# Patient Record
Sex: Female | Born: 1960 | Race: White | Hispanic: No | Marital: Married | Smoking: Former smoker
Health system: Southern US, Community
[De-identification: ages and names within clinical notes are randomized; demographics above are authoritative.]

## PROBLEM LIST (undated history)

## (undated) DIAGNOSIS — J449 Chronic obstructive pulmonary disease, unspecified: Secondary | ICD-10-CM

## (undated) DIAGNOSIS — G609 Hereditary and idiopathic neuropathy, unspecified: Secondary | ICD-10-CM

## (undated) DIAGNOSIS — F319 Bipolar disorder, unspecified: Secondary | ICD-10-CM

## (undated) DIAGNOSIS — N189 Chronic kidney disease, unspecified: Secondary | ICD-10-CM

## (undated) DIAGNOSIS — G629 Polyneuropathy, unspecified: Secondary | ICD-10-CM

## (undated) DIAGNOSIS — Z9109 Other allergy status, other than to drugs and biological substances: Secondary | ICD-10-CM

## (undated) DIAGNOSIS — N8111 Cystocele, midline: Secondary | ICD-10-CM

## (undated) DIAGNOSIS — I739 Peripheral vascular disease, unspecified: Secondary | ICD-10-CM

## (undated) DIAGNOSIS — E782 Mixed hyperlipidemia: Secondary | ICD-10-CM

## (undated) DIAGNOSIS — D369 Benign neoplasm, unspecified site: Secondary | ICD-10-CM

## (undated) DIAGNOSIS — N8110 Cystocele, unspecified: Secondary | ICD-10-CM

## (undated) HISTORY — DX: Cystocele, midline: N81.11

## (undated) HISTORY — DX: Hereditary and idiopathic neuropathy, unspecified: G60.9

## (undated) HISTORY — DX: Chronic obstructive pulmonary disease, unspecified: J44.9

## (undated) HISTORY — DX: Mixed hyperlipidemia: E78.2

## (undated) HISTORY — DX: Bipolar disorder, unspecified: F31.9

## (undated) HISTORY — PX: VARICOSE VEIN SURGERY: SHX832

## (undated) HISTORY — DX: Cystocele, unspecified: N81.10

## (undated) HISTORY — DX: Benign neoplasm, unspecified site: D36.9

## (undated) HISTORY — DX: Polyneuropathy, unspecified: G62.9

## (undated) HISTORY — PX: TONSILLECTOMY AND ADENOIDECTOMY: SUR1326

## (undated) HISTORY — PX: LUMBAR DISC SURGERY: SHX700

---

## 2007-10-19 ENCOUNTER — Other Ambulatory Visit: Admission: RE | Admit: 2007-10-19 | Discharge: 2007-10-19 | Payer: Self-pay | Admitting: Family Medicine

## 2007-11-01 ENCOUNTER — Ambulatory Visit: Payer: Self-pay | Admitting: Vascular Surgery

## 2009-07-25 ENCOUNTER — Other Ambulatory Visit: Admission: RE | Admit: 2009-07-25 | Discharge: 2009-07-25 | Payer: Self-pay | Admitting: Obstetrics and Gynecology

## 2009-08-04 ENCOUNTER — Encounter: Admission: RE | Admit: 2009-08-04 | Discharge: 2009-08-04 | Payer: Self-pay | Admitting: Obstetrics and Gynecology

## 2009-09-16 ENCOUNTER — Encounter (INDEPENDENT_AMBULATORY_CARE_PROVIDER_SITE_OTHER): Payer: Self-pay | Admitting: Obstetrics and Gynecology

## 2009-09-16 ENCOUNTER — Ambulatory Visit (HOSPITAL_COMMUNITY): Admission: RE | Admit: 2009-09-16 | Discharge: 2009-09-17 | Payer: Self-pay | Admitting: Obstetrics and Gynecology

## 2009-09-16 HISTORY — PX: VAGINAL HYSTERECTOMY: SUR661

## 2009-11-14 ENCOUNTER — Ambulatory Visit: Payer: Self-pay | Admitting: Vascular Surgery

## 2010-09-16 ENCOUNTER — Ambulatory Visit: Admit: 2010-09-16 | Payer: Self-pay | Admitting: Vascular Surgery

## 2010-11-09 LAB — TYPE AND SCREEN: ABO/RH(D): A POS

## 2010-11-09 LAB — CBC
HCT: 34.5 % — ABNORMAL LOW (ref 36.0–46.0)
Hemoglobin: 11.5 g/dL — ABNORMAL LOW (ref 12.0–15.0)
MCV: 90.9 fL (ref 78.0–100.0)
Platelets: 240 10*3/uL (ref 150–400)
RBC: 3.75 MIL/uL — ABNORMAL LOW (ref 3.87–5.11)
RBC: 4.27 MIL/uL (ref 3.87–5.11)
RDW: 13.4 % (ref 11.5–15.5)
WBC: 10.6 10*3/uL — ABNORMAL HIGH (ref 4.0–10.5)
WBC: 5.7 10*3/uL (ref 4.0–10.5)

## 2010-11-09 LAB — PREGNANCY, URINE: Preg Test, Ur: NEGATIVE

## 2010-11-09 LAB — ABO/RH: ABO/RH(D): A POS

## 2010-11-09 LAB — BASIC METABOLIC PANEL
BUN: 9 mg/dL (ref 6–23)
Chloride: 105 mEq/L (ref 96–112)
GFR calc non Af Amer: >60 mL/min (ref >60)
Glucose, Bld: 84 mg/dL (ref 70–99)
Potassium: 3.6 mEq/L (ref 3.5–5.1)
Sodium: 135 mEq/L (ref 135–145)

## 2010-11-09 LAB — URINALYSIS, ROUTINE W REFLEX MICROSCOPIC
Glucose, UA: NEGATIVE mg/dL
Hgb urine dipstick: NEGATIVE
Ketones, ur: NEGATIVE mg/dL
Protein, ur: NEGATIVE mg/dL
pH: 6 (ref 5.0–8.0)

## 2011-01-05 NOTE — Consult Note (Signed)
NEW PATIENT CONSULTATION   Sheila, Spencer  DOB:  01-24-1961                                       11/01/2007  [id number]#:[id number]   Patient presents today for evaluation of right leg discomfort.  She is a  healthy 50 year old white female with progressive discomfort over both  legs, most particularly in her right.  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  She does not have any history of hemorrhage from these.  She does have  frequent episodes of subcutaneous rupture of the varicosities with  bruising.  She has not worn any compression garments and is not taking  any current pain medication.  She does elevate her legs as much as  possible.   PAST MEDICAL HISTORY:  Significant for asthma, allergic rhinitis, and  bipolar disorder.   PAST SURGICAL HISTORY:  Significant for tonsillitis.   FAMILY HISTORY:  Significant for varicosities in her mother and sister.   She is married with one child.  She does smoke cigarettes.  She does  have several alcohol drinks per day.   Her weight is reported at 186 pounds.  She is 5 foot 7 inches tall.  Review of systems otherwise unremarkable.   Allergies are to penicillin, which causes respiratory distress.   Her only medication currently is Care[employer] adviser.   PHYSICAL EXAMINATION:  A well-developed and well-nourished white female  appearing her stated age of 50.  Blood pressure is 128/57, heart rate  77, respirations 18.  She does have 2+ dorsalis pedis pulses  bilaterally.  She has slight reticular varicosities in her left leg, and  her right leg does have marked varicosities extending from her medial  thigh past her knee and down onto her pretibial area.   She underwent screening hand-held duplex by me in the office, and this  does confirm gross reflux in her saphenous vein, feeding these  varicosities.  I have discussed options with the patient.  I have  recommended that we  being graduated compression garments and have fitted  her with thigh-high 20-30 mmHg garments.  She will continue with these  and attempt treatment with ibuprofen for discomfort.  I plan to see her  again in three months with formal duplex at that time.   Larina Earthly, M.D.  Electronically Signed   TFE/MEDQ  D:  11/01/2007  T:  11/02/2007  Job:  [phone]

## 2012-01-14 ENCOUNTER — Other Ambulatory Visit: Payer: Self-pay

## 2012-01-14 DIAGNOSIS — I83893 Varicose veins of bilateral lower extremities with other complications: Secondary | ICD-10-CM

## 2012-01-31 ENCOUNTER — Other Ambulatory Visit: Payer: Self-pay | Admitting: Family Medicine

## 2012-01-31 ENCOUNTER — Other Ambulatory Visit: Payer: Self-pay | Admitting: Obstetrics and Gynecology

## 2012-01-31 DIAGNOSIS — Z1231 Encounter for screening mammogram for malignant neoplasm of breast: Secondary | ICD-10-CM

## 2012-02-11 ENCOUNTER — Ambulatory Visit
Admission: RE | Admit: 2012-02-11 | Discharge: 2012-02-11 | Disposition: A | Discharge status: Home / Self Care | Payer: PRIVATE HEALTH INSURANCE | Source: Ambulatory Visit | Attending: Family Medicine | Admitting: Family Medicine

## 2012-02-11 ENCOUNTER — Ambulatory Visit: Payer: Self-pay

## 2012-02-11 DIAGNOSIS — Z1231 Encounter for screening mammogram for malignant neoplasm of breast: Secondary | ICD-10-CM

## 2012-02-17 ENCOUNTER — Encounter: Payer: Self-pay | Admitting: Vascular Surgery

## 2012-03-13 ENCOUNTER — Encounter: Payer: Self-pay | Admitting: Vascular Surgery

## 2012-03-23 ENCOUNTER — Encounter: Payer: Self-pay | Admitting: Vascular Surgery

## 2012-03-24 ENCOUNTER — Encounter: Payer: Self-pay | Admitting: Vascular Surgery

## 2012-04-28 ENCOUNTER — Other Ambulatory Visit: Payer: Self-pay | Admitting: Gastroenterology

## 2012-06-20 ENCOUNTER — Encounter (INDEPENDENT_AMBULATORY_CARE_PROVIDER_SITE_OTHER): Payer: Self-pay

## 2012-06-28 ENCOUNTER — Encounter (INDEPENDENT_AMBULATORY_CARE_PROVIDER_SITE_OTHER): Payer: Self-pay | Admitting: General Surgery

## 2012-07-04 ENCOUNTER — Encounter (INDEPENDENT_AMBULATORY_CARE_PROVIDER_SITE_OTHER): Payer: Self-pay | Admitting: General Surgery

## 2012-07-04 ENCOUNTER — Ambulatory Visit (INDEPENDENT_AMBULATORY_CARE_PROVIDER_SITE_OTHER): Payer: BC Managed Care – PPO | Admitting: General Surgery

## 2012-07-04 VITALS — BP 118/68 | HR 80 | Temp 97.7°F | Resp 20 | Ht 68.5 in | Wt 184.6 lb

## 2012-07-04 DIAGNOSIS — N824 Other female intestinal-genital tract fistulae: Secondary | ICD-10-CM

## 2012-07-04 DIAGNOSIS — N823 Fistula of vagina to large intestine: Secondary | ICD-10-CM

## 2012-07-04 NOTE — Patient Instructions (Signed)
  Fiber Chart  You should 25-30g of fiber per day and drinking 8 glasses of water to help your bowels move regularly.  In the chart below you can look up how much fiber you are getting in an average day.  If you are not getting enough fiber, you should add a fiber supplement to your diet.  Examples of this include Metamucil, FiberCon and Citrucel.  These can be purchased at your local grocery store or pharmacy.      http://www.[employer].edu/offices/health/nutritioncoach/AtoZ/handouts/Fiber.pdf    GETTING TO GOOD BOWEL HEALTH. Irregular bowel habits such as constipation can lead to many problems over time.  Having one soft bowel movement a day is the most important way to prevent further problems.  The anorectal canal is designed to handle stretching and feces to safely manage our ability to get rid of solid waste (feces, poop, stool) out of our body.  BUT, hard constipated stools can act like ripping concrete bricks causing inflamed hemorrhoids, anal fissures, abdominal pain and bloating.     The goal: ONE SOFT BOWEL MOVEMENT A DAY!  To have soft, regular bowel movements:    Drink at least 8 tall glasses of water a day.     Take plenty of fiber.  Fiber is the undigested part of plant food that passes into the colon, acting s "natures broom" to encourage bowel motility and movement.  Fiber can absorb and hold large amounts of water. This results in a larger, bulkier stool, which is soft and easier to pass. Work gradually over several weeks up to 6 servings a day of fiber (25g a day even more if needed) in the form of: o Vegetables -- Root (potatoes, carrots, turnips), leafy green (lettuce, salad greens, celery, spinach), or cooked high residue (cabbage, broccoli, etc) o Fruit -- Fresh (unpeeled skin & pulp), Dried (prunes, apricots, cherries, etc ),  or stewed ( applesauce)  o Whole grain breads, pasta, etc (whole wheat)  o Bran cereals    Bulking Agents -- This type of water-retaining fiber  generally is easily obtained each day by one of the following:  o Psyllium bran -- The psyllium plant is remarkable because its ground seeds can retain so much water. This product is available as Metamucil, Konsyl, Effersyllium, Per Diem Fiber, or the less expensive generic preparation in drug and health food stores. Although labeled a laxative, it really is not a laxative.  o Methylcellulose -- This is another fiber derived from wood which also retains water. It is available as Citrucel. o Polyethylene Glycol - and "artificial" fiber commonly called Miralax or Glycolax.  It is helpful for people with gassy or bloated feelings with regular fiber o Flax Seed - a less gassy fiber than psyllium   No reading or other relaxing activity while on the toilet. If bowel movements take longer than 5 minutes, you are too constipated.   AVOID CONSTIPATION.  High fiber and water intake usually takes care of this.  Sometimes a laxative is needed to stimulate more frequent bowel movements, but    Laxatives are not a good long-term solution as it can wear the colon out. o Osmotics (Milk of Magnesia, Fleets phosphosoda, Magnesium citrate, MiraLax, GoLytely) are safer than  o Stimulants (Senokot, Castor Oil, Dulcolax, Ex Lax)    o Do not take laxatives for more than 7days in a row.    IF SEVERELY CONSTIPATED, try a Bowel Retraining Program: o Do not use laxatives.  o Eat a diet high in   roughage, such as bran cereals and leafy vegetables.  o Drink six (6) ounces of prune or apricot juice each morning.  o Eat two (2) large servings of stewed fruit each day.  o Take one (1) heaping tablespoon of a psyllium-based bulking agent twice a day. Use sugar-free sweetener when possible to avoid excessive calories.  o Eat a normal breakfast.  o Set aside 15 minutes after breakfast to sit on the toilet, but do not strain to have a bowel movement.  o If you do not have a bowel movement by the third day, use an enema and repeat the  above steps.   
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
## 2012-08-01 ENCOUNTER — Encounter (HOSPITAL_COMMUNITY): Payer: Self-pay | Admitting: Pharmacy Technician

## 2012-08-01 ENCOUNTER — Ambulatory Visit (INDEPENDENT_AMBULATORY_CARE_PROVIDER_SITE_OTHER): Payer: BC Managed Care – PPO | Admitting: General Surgery

## 2012-08-01 ENCOUNTER — Encounter (INDEPENDENT_AMBULATORY_CARE_PROVIDER_SITE_OTHER): Payer: Self-pay | Admitting: General Surgery

## 2012-08-01 VITALS — BP 120/78 | HR 72 | Temp 96.9°F | Resp 18 | Ht 68.5 in | Wt 184.0 lb

## 2012-08-01 DIAGNOSIS — N824 Other female intestinal-genital tract fistulae: Secondary | ICD-10-CM

## 2012-08-01 DIAGNOSIS — N823 Fistula of vagina to large intestine: Secondary | ICD-10-CM

## 2012-08-01 NOTE — Patient Instructions (Signed)
Anal Fistula   What is the treatment of an anal fistula? Surgery is almost always necessary to cure an anal fistula. Although surgery can be fairly straightforward, it may also be complicated, occasionally requiring staged or multiple operations. Consider identifying a specialist in colon and rectal surgery who would be familiar with a number of potential operations to treat the fistula. The surgery may be performed at the same time as drainage of an abscess, although sometimes the fistula doesn't appear until weeks to years after the initial drainage. If the fistula is straightforward, a fistulotomy may be performed. This procedure involves connecting the internal opening within the anal canal to the external opening, creating a groove that will heal from the inside out. This surgery often will require dividing a small portion of the sphincter muscle which has the unlikely potential for affecting the control of bowel movements in a limited number of cases.  Other procedures include placing material within the fistula tract to occlude it or surgically altering the surrounding tissue to accomplish closure of the fistula, with the choice of procedure depending upon the type, length, and location of the fistula. Most of the operations can be performed on an outpatient basis, but may occasionally require hospitalization. What is the recovery like from surgery? Pain after surgery is controlled with pain pills, fiber and bulk laxatives. Patients should plan for time at home using sitz baths and attempt to avoid the constipation that can be associated with prescription pain medication. Discuss with your surgeon the specific care and time away from work prior to surgery to prepare yourself for post-operative care. Can the abscess or fistula recur? If adequately treated and properly healed, both are unlikely to return. However, despite proper and indicated open or minimally invasive treatment, both abscesses and  fistulas can potentially recur. Should similar symptoms arise, suggesting recurrence, it is recommended that you find a colon and rectal surgeon to manage your condition. What is a colon and rectal surgeon? Colon and rectal surgeons are experts in the surgical and non-surgical treatment of diseases of the colon, rectum and anus. They have completed advanced surgical training in the treatment of these diseases as well as full general surgical training. Board-certified colon and rectal surgeons complete residencies in general surgery and colon and rectal surgery, and pass intensive examinations conducted by the American Board of Surgery and the American Board of Colon and Rectal Surgery. They are well-versed in the treatment of both benign and malignant diseases of the colon, rectum and anus and are able to perform routine screening examinations and surgically treat conditions if indicated to do so.  author: Mikle Bosworth, MD, FACS, FASCRS, on behalf of the ASCRS Public Relations Committee  [id number] American Society of Colon & Rectal Surgeons
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
## 2012-08-03 NOTE — Patient Instructions (Signed)
20 Sheila Spencer  08/03/2012   Your procedure is scheduled on: 08/07/12   Report to [facility] at 0530 AM.  Call this number if you have problems the morning of surgery: [phone]   Remember: fleets enema    Do not eat food:After Midnight.  May have clear liquids:until Midnight .  Marland Kitchen  Take these medicines the morning of surgery with A SIP OF WATER:    Do not wear jewelry, make-up or nail polish.  Do not wear lotions, powders, or perfumes.   Do not shave 48 hours prior to surgery. .  Do not bring valuables to the hospital.  Contacts, dentures or bridgework may not be worn into surgery.      Patients discharged the day of surgery will not be allowed to drive home.  Name and phone number of your driver:    SEE CHG INSTRUCTION SHEET   Please read over the following fact sheets that you were given: MRSA Information, coughing and deep breathing exercises, leg exercises              Failure to comply with these instructions may result in cancellation of your surgery.              Patient Signature ________________________________________________________              Nurse Signature ________________________________________________________

## 2012-08-04 ENCOUNTER — Encounter (HOSPITAL_COMMUNITY)
Admission: RE | Admit: 2012-08-04 | Discharge: 2012-08-04 | Disposition: A | Discharge status: Home / Self Care | Payer: BC Managed Care – PPO | Source: Ambulatory Visit | Attending: General Surgery | Admitting: General Surgery

## 2012-08-04 ENCOUNTER — Encounter (HOSPITAL_COMMUNITY): Payer: Self-pay

## 2012-08-04 HISTORY — DX: Peripheral vascular disease, unspecified: I73.9

## 2012-08-04 HISTORY — DX: Chronic kidney disease, unspecified: N18.9

## 2012-08-04 LAB — CBC
HCT: 41.5 % (ref 36.0–46.0)
Hemoglobin: 14.2 g/dL (ref 12.0–15.0)
MCH: 31.9 pg (ref 26.0–34.0)
MCHC: 34.2 g/dL (ref 30.0–36.0)
MCV: 93.3 fL (ref 78.0–100.0)
RBC: 4.45 MIL/uL (ref 3.87–5.11)

## 2012-08-04 LAB — SURGICAL PCR SCREEN: MRSA, PCR: POSITIVE — AB

## 2012-08-04 NOTE — Progress Notes (Signed)
Spoke with pharmacy regarding patient allergy to egg and egg derived products.  Asked pharmacy would CHG liquid be ok for patient to use;  Spoke with [facility] and she stated no egg or egg derived products in CHG.

## 2012-08-04 NOTE — Progress Notes (Signed)
Order placed for EKG morning of surgery. Did not see EKG when checking chart. Per anesthesia protocol, patient requires EKG.

## 2012-08-04 NOTE — Progress Notes (Signed)
Left message for patient on [phone] regarding positive for staph and MRSA on Pcr Screen.  Asked patient to return call to make sure she received message and understood instructions.

## 2012-08-04 NOTE — Progress Notes (Signed)
Contacted patient regarding positive pcr results for staph and MRSA.  Patient understands to obtain prescription for Mupirocin and follow instructions.

## 2012-08-06 NOTE — Anesthesia Preprocedure Evaluation (Addendum)
Anesthesia Evaluation  Patient identified by MRN, date of birth, ID band Patient awake    Reviewed: Allergy & Precautions, H&P , NPO status , Patient's Chart, lab work & pertinent test results  Airway Mallampati: II TM Distance: >3 FB Neck ROM: Full    Dental  (+) Teeth Intact and Dental Advisory Given   Pulmonary COPD   Pulmonary exam normal       Cardiovascular - Peripheral Vascular Disease Rhythm:Regular Rate:Normal     Neuro/Psych Bipolar Disorder  Neuromuscular disease    GI/Hepatic negative GI ROS, Neg liver ROS,   Endo/Other  negative endocrine ROS  Renal/GU Renal diseasenegative Renal ROS  negative genitourinary   Musculoskeletal negative musculoskeletal ROS (+)   Abdominal   Peds negative pediatric ROS (+)  Hematology negative hematology ROS (+)   Anesthesia Other Findings   Reproductive/Obstetrics negative OB ROS                          Anesthesia Physical Anesthesia Plan  ASA: II  Anesthesia Plan: General   Post-op Pain Management:    Induction: Intravenous  Airway Management Planned: Oral ETT  Additional Equipment:   Intra-op Plan:   Post-operative Plan: Extubation in OR  Informed Consent: I have reviewed the patients History and Physical, chart, labs and discussed the procedure including the risks, benefits and alternatives for the proposed anesthesia with the patient or authorized representative who has indicated his/her understanding and acceptance.   Dental advisory given  Plan Discussed with: CRNA  Anesthesia Plan Comments: (Note egg allergy)        Anesthesia Quick Evaluation

## 2012-08-07 ENCOUNTER — Encounter (HOSPITAL_COMMUNITY): Payer: Self-pay | Admitting: Anesthesiology

## 2012-08-07 ENCOUNTER — Encounter (HOSPITAL_COMMUNITY)
Admission: RE | Disposition: A | Discharge status: Home / Self Care | Payer: Self-pay | Source: Ambulatory Visit | Attending: General Surgery

## 2012-08-07 ENCOUNTER — Ambulatory Visit (HOSPITAL_COMMUNITY): Payer: BC Managed Care – PPO | Admitting: Anesthesiology

## 2012-08-07 ENCOUNTER — Encounter (HOSPITAL_COMMUNITY): Payer: Self-pay | Admitting: *Deleted

## 2012-08-07 ENCOUNTER — Ambulatory Visit (HOSPITAL_COMMUNITY)
Admission: RE | Admit: 2012-08-07 | Discharge: 2012-08-07 | Disposition: A | Discharge status: Home / Self Care | Payer: BC Managed Care – PPO | Source: Ambulatory Visit | Attending: General Surgery | Admitting: General Surgery

## 2012-08-07 DIAGNOSIS — Z79899 Other long term (current) drug therapy: Secondary | ICD-10-CM | POA: Insufficient documentation

## 2012-08-07 DIAGNOSIS — N824 Other female intestinal-genital tract fistulae: Secondary | ICD-10-CM | POA: Insufficient documentation

## 2012-08-07 DIAGNOSIS — Z01812 Encounter for preprocedural laboratory examination: Secondary | ICD-10-CM | POA: Insufficient documentation

## 2012-08-07 DIAGNOSIS — J4489 Other specified chronic obstructive pulmonary disease: Secondary | ICD-10-CM | POA: Insufficient documentation

## 2012-08-07 DIAGNOSIS — J449 Chronic obstructive pulmonary disease, unspecified: Secondary | ICD-10-CM | POA: Insufficient documentation

## 2012-08-07 HISTORY — PX: ANAL FISTULECTOMY: SHX1139

## 2012-08-07 SURGERY — FISTULECTOMY, ANAL
Anesthesia: General | Site: Buttocks | Wound class: Clean Contaminated

## 2012-08-07 MED ORDER — SODIUM CHLORIDE 0.9 % IV SOLN: 250.0000 mL | INTRAVENOUS | Status: DC | PRN

## 2012-08-07 MED ORDER — LIDOCAINE HCL 2 % EX GEL
CUTANEOUS | Status: DC | PRN
  Administered 2012-08-07: 1 via TOPICAL

## 2012-08-07 MED ORDER — SODIUM CHLORIDE 0.9 % IJ SOLN: 3.0000 mL | Freq: Two times a day (BID) | INTRAMUSCULAR | Status: DC

## 2012-08-07 MED ORDER — ONDANSETRON HCL 4 MG/2ML IJ SOLN: 4.0000 mg | Freq: Four times a day (QID) | INTRAMUSCULAR | Status: DC | PRN

## 2012-08-07 MED ORDER — DOCUSATE SODIUM 100 MG PO CAPS: 100.0000 mg | ORAL_CAPSULE | Freq: Two times a day (BID) | ORAL | Status: DC

## 2012-08-07 MED ORDER — LACTATED RINGERS IV SOLN
INTRAVENOUS | Status: DC | PRN
  Administered 2012-08-07: 07:00:00 via INTRAVENOUS

## 2012-08-07 MED ORDER — HYDROCORTISONE SOD SUCCINATE 100 MG IJ SOLR
INTRAMUSCULAR | Status: DC | PRN
  Administered 2012-08-07: 100 mg via INTRAVENOUS

## 2012-08-07 MED ORDER — LIDOCAINE HCL 4 % MT SOLN
OROMUCOSAL | Status: DC | PRN
  Administered 2012-08-07: 4 mL via TOPICAL

## 2012-08-07 MED ORDER — LACTATED RINGERS IV SOLN
INTRAVENOUS | Status: DC
  Administered 2012-08-07: 1000 mL via INTRAVENOUS

## 2012-08-07 MED ORDER — METRONIDAZOLE IN NACL 5-0.79 MG/ML-% IV SOLN
500.0000 mg | INTRAVENOUS | Status: AC
  Administered 2012-08-07: 1 g via INTRAVENOUS

## 2012-08-07 MED ORDER — CIPROFLOXACIN IN D5W 400 MG/200ML IV SOLN
INTRAVENOUS | Status: AC
  Filled 2012-08-07: qty 200

## 2012-08-07 MED ORDER — LIDOCAINE HCL 2 % EX GEL
CUTANEOUS | Status: AC
  Filled 2012-08-07: qty 10

## 2012-08-07 MED ORDER — ETOMIDATE 2 MG/ML IV SOLN
INTRAVENOUS | Status: DC | PRN
  Administered 2012-08-07: 16 mg via INTRAVENOUS
  Administered 2012-08-07: 4 mg via INTRAVENOUS

## 2012-08-07 MED ORDER — LIDOCAINE HCL (CARDIAC) 20 MG/ML IV SOLN
INTRAVENOUS | Status: DC | PRN
  Administered 2012-08-07: 20 mg via INTRAVENOUS

## 2012-08-07 MED ORDER — FLEET ENEMA 7-19 GM/118ML RE ENEM: 1.0000 | ENEMA | Freq: Once | RECTAL | Status: DC

## 2012-08-07 MED ORDER — ACETAMINOPHEN 650 MG RE SUPP: 650.0000 mg | RECTAL | Status: DC | PRN

## 2012-08-07 MED ORDER — OXYCODONE HCL 5 MG PO TABS: 5.0000 mg | ORAL_TABLET | ORAL | Status: DC | PRN

## 2012-08-07 MED ORDER — ACETAMINOPHEN 10 MG/ML IV SOLN
INTRAVENOUS | Status: AC
  Filled 2012-08-07: qty 100

## 2012-08-07 MED ORDER — CIPROFLOXACIN IN D5W 400 MG/200ML IV SOLN
400.0000 mg | INTRAVENOUS | Status: AC
  Administered 2012-08-07: 400 mg via INTRAVENOUS

## 2012-08-07 MED ORDER — MIDAZOLAM HCL 5 MG/5ML IJ SOLN
INTRAMUSCULAR | Status: DC | PRN
  Administered 2012-08-07: 2 mg via INTRAVENOUS

## 2012-08-07 MED ORDER — BUPIVACAINE-EPINEPHRINE 0.25% -1:200000 IJ SOLN
INTRAMUSCULAR | Status: AC
  Filled 2012-08-07: qty 1

## 2012-08-07 MED ORDER — PROMETHAZINE HCL 25 MG/ML IJ SOLN: 6.2500 mg | INTRAMUSCULAR | Status: DC | PRN

## 2012-08-07 MED ORDER — SODIUM CHLORIDE 0.9 % IJ SOLN: 3.0000 mL | INTRAMUSCULAR | Status: DC | PRN

## 2012-08-07 MED ORDER — ACETAMINOPHEN 325 MG PO TABS: 650.0000 mg | ORAL_TABLET | ORAL | Status: DC | PRN

## 2012-08-07 MED ORDER — ONDANSETRON HCL 4 MG/2ML IJ SOLN
INTRAMUSCULAR | Status: DC | PRN
  Administered 2012-08-07: 4 mg via INTRAVENOUS

## 2012-08-07 MED ORDER — HYDROMORPHONE HCL PF 1 MG/ML IJ SOLN: 0.2500 mg | INTRAMUSCULAR | Status: DC | PRN

## 2012-08-07 MED ORDER — BUPIVACAINE-EPINEPHRINE 0.25% -1:200000 IJ SOLN
INTRAMUSCULAR | Status: DC | PRN
  Administered 2012-08-07: 10 mL

## 2012-08-07 MED ORDER — HYDROCORTISONE SOD SUCCINATE 100 MG IJ SOLR
INTRAMUSCULAR | Status: AC
  Filled 2012-08-07: qty 2

## 2012-08-07 MED ORDER — FENTANYL CITRATE 0.05 MG/ML IJ SOLN
INTRAMUSCULAR | Status: DC | PRN
  Administered 2012-08-07: 100 ug via INTRAVENOUS
  Administered 2012-08-07: 50 ug via INTRAVENOUS
  Administered 2012-08-07: 100 ug via INTRAVENOUS
  Administered 2012-08-07: 50 ug via INTRAVENOUS

## 2012-08-07 SURGICAL SUPPLY — 47 items
BLADE HEX COATED 2.75 (ELECTRODE) ×2 IMPLANT
BLADE SURG 15 STRL LF DISP TIS (BLADE) ×1 IMPLANT
BLADE SURG 15 STRL SS (BLADE) ×1
BRIEF STRETCH FOR OB PAD LRG (UNDERPADS AND DIAPERS) ×2 IMPLANT
CANISTER SUCTION 2500CC (MISCELLANEOUS) ×2 IMPLANT
CLOTH BEACON ORANGE TIMEOUT ST (SAFETY) ×2 IMPLANT
COVER MAYO STAND STRL (DRAPES) IMPLANT
DECANTER SPIKE VIAL GLASS SM (MISCELLANEOUS) ×2 IMPLANT
DRAPE LG THREE QUARTER DISP (DRAPES) IMPLANT
DRAPE UTILITY 15X26 (DRAPE) ×2 IMPLANT
DRSG PAD ABDOMINAL 8X10 ST (GAUZE/BANDAGES/DRESSINGS) ×2 IMPLANT
ELECT REM PT RETURN 9FT ADLT (ELECTROSURGICAL) ×2
ELECTRODE REM PT RTRN 9FT ADLT (ELECTROSURGICAL) ×1 IMPLANT
GAUZE SPONGE 4X4 16PLY XRAY LF (GAUZE/BANDAGES/DRESSINGS) ×2 IMPLANT
GAUZE VASELINE 3X9 (GAUZE/BANDAGES/DRESSINGS) IMPLANT
GLOVE BIO SURGEON STRL SZ7 (GLOVE) ×2 IMPLANT
GLOVE BIO SURGEON STRL SZ7.5 (GLOVE) ×2 IMPLANT
GLOVE BIOGEL M STRL SZ7.5 (GLOVE) IMPLANT
GLOVE BIOGEL PI IND STRL 7.0 (GLOVE) ×1 IMPLANT
GLOVE BIOGEL PI INDICATOR 7.0 (GLOVE) ×1
GLOVE INDICATOR 8.0 STRL GRN (GLOVE) ×2 IMPLANT
GOWN PREVENTION PLUS LG XLONG (DISPOSABLE) ×2 IMPLANT
GOWN PREVENTION PLUS XLARGE (GOWN DISPOSABLE) ×2 IMPLANT
GOWN STRL NON-REIN LRG LVL3 (GOWN DISPOSABLE) ×2 IMPLANT
GOWN STRL REIN XL XLG (GOWN DISPOSABLE) ×2 IMPLANT
KIT BASIN OR (CUSTOM PROCEDURE TRAY) ×2 IMPLANT
LUBRICANT JELLY K Y 4OZ (MISCELLANEOUS) ×2 IMPLANT
NDL SAFETY ECLIPSE 18X1.5 (NEEDLE) IMPLANT
NEEDLE HYPO 18GX1.5 SHARP (NEEDLE)
NEEDLE HYPO 25X1 1.5 SAFETY (NEEDLE) ×2 IMPLANT
NS IRRIG 1000ML POUR BTL (IV SOLUTION) ×2 IMPLANT
PACK BASIC VI WITH GOWN DISP (CUSTOM PROCEDURE TRAY) IMPLANT
PACK GENERAL/GYN (CUSTOM PROCEDURE TRAY) ×2 IMPLANT
PENCIL BUTTON HOLSTER BLD 10FT (ELECTRODE) IMPLANT
SPONGE GAUZE 4X4 12PLY (GAUZE/BANDAGES/DRESSINGS) ×4 IMPLANT
SPONGE SURGIFOAM ABS GEL 100 (HEMOSTASIS) ×2 IMPLANT
SPONGE SURGIFOAM ABS GEL 12-7 (HEMOSTASIS) ×4 IMPLANT
SUT CHROMIC 2 0 SH (SUTURE) ×6 IMPLANT
SUT CHROMIC 3 0 SH 27 (SUTURE) IMPLANT
SUT MON AB 3-0 SH 27 (SUTURE)
SUT MON AB 3-0 SH27 (SUTURE) IMPLANT
SUT VIC AB 2-0 SH 18 (SUTURE) ×2 IMPLANT
SUT VIC AB 4-0 P-3 18XBRD (SUTURE) IMPLANT
SUT VIC AB 4-0 P3 18 (SUTURE)
SYR CONTROL 10ML LL (SYRINGE) ×2 IMPLANT
TOWEL OR 17X26 10 PK STRL BLUE (TOWEL DISPOSABLE) ×2 IMPLANT
YANKAUER SUCT BULB TIP 10FT TU (MISCELLANEOUS) IMPLANT

## 2012-08-07 NOTE — Anesthesia Postprocedure Evaluation (Signed)
Anesthesia Post Note  Patient: Sheila Spencer  Procedure(s) Performed: Procedure(s) (LRB): FISTULECTOMY ANAL (N/A)  Anesthesia type: General  Patient location: PACU  Post pain: Pain level controlled  Post assessment: Post-op Vital signs reviewed  Last Vitals:  Filed Vitals:   08/07/12 1001  BP: 119/68  Pulse: 66  Temp: 36.4 C  Resp: 14    Post vital signs: Reviewed  Level of consciousness: sedated  Complications: No apparent anesthesia complications

## 2012-08-07 NOTE — Interval H&P Note (Signed)
History and Physical Interval Note:  08/07/2012 7:42 AM  Sheila Spencer  has presented today for surgery, with the diagnosis of Rectovaginal fistula  The various methods of treatment have been discussed with the patient and family. After consideration of risks, benefits and other options for treatment, the patient has consented to  Procedure(s) (LRB) with comments: FISTULECTOMY ANAL (N/A) - Mucosal Advancement Flap as a surgical intervention .  The patient's history has been reviewed, patient examined, no change in status, stable for surgery.  I have reviewed the patient's chart and labs.  Questions were answered to the patient's satisfaction.     Vanita Panda, MD  Colorectal and General Surgery [facility]

## 2012-08-07 NOTE — Op Note (Signed)
08/07/2012  9:14 AM  PATIENT:  Franklyn Lor  51 y.o. female  Patient Care Team: Gretel Acre, MD as PCP - General (Family Medicine)  PRE-OPERATIVE DIAGNOSIS:  Rectovaginal fistula  POST-OPERATIVE DIAGNOSIS:  Rectovaginal fistula  PROCEDURE:  Procedure(s): Rectal Mucosa Advancement Flap  SURGEON:  Surgeon(s): Romie Levee, MD  ASSISTANT: none   ANESTHESIA:   local and general  EBL:   30ml  Delay start of Pharmacological VTE agent (>24hrs) due to surgical blood loss or risk of bleeding:  not applicable  DRAINS: none   SPECIMEN:  No Specimen  DISPOSITION OF SPECIMEN:  N/A  COUNTS:  YES  PLAN OF CARE: Discharge to home after PACU  PATIENT DISPOSITION:  PACU - hemodynamically stable.  INDICATION: this is a 51 year old female who is status post rectovaginal fistula repair several years ago. It recurred. She is here today for another attempt at closure. The risk and benefits of this procedure were placed the patient regular and consent was signed and placed on chart.  OR FINDINGS: rectovaginal fistula  DESCRIPTION: the patient was identified in the preoperative holding area and taken to the OR where she was laid supine on the operating room gurney. General anesthesia was smoothly induced. The patient was then laid prone on the operating room table in jackknife position. Her buttocks were gently taped apart. Her perianal region was prepped and draped in the usual sterile fashion. A surgical timeout was performed indicating the correct patient, procedure, positioning and preoperative antibiotics. SCDs were also noted to be in place prior to the initiation of anesthesia. After this was completed a endoscopy was performed using a Hill-Ferguson anoscope. The rectovaginal fistula was identified anteriorly. It was an approximately 1 cm defect. I began by making a incision at the dentate line with the Bovie electrocautery. I then infused quarter percent Marcaine with epinephrine  submucosally to lift up the rectal flap. I then used Bovie and blunt dissection to elevate a rectal mucosa flap which was wider at the base than at the tip.  Hemostasis was obtained using direct pressure and focused Bovie electrocautery. After the flap was mobilized I divided the plane between the vagina and the muscular layer of the rectus. The muscular layer was closed over the vaginal mucosa with 2-0 Vicryl sutures. The vaginal mucosa was also closed using 2-0 Vicryl sutures. I then closed the sides of the advancement flap using a running 2-0 chromic suture on either side. I then brought the flap over the area of the fistula and close the edge of the flap to the dentate line using interrupted 2-0 chromic sutures. After this was completed and hemostasis was achieved Gelfoam was inserted into the rectal canal for additional hemostasis. A sterile dressing was applied. The patient was then awakened from anesthesia and sent to the postanesthesia care unit in stable condition. All counts were correct per operating room staff.
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
## 2012-08-07 NOTE — Transfer of Care (Signed)
Immediate Anesthesia Transfer of Care Note  Patient: Sheila Spencer  Procedure(s) Performed: Procedure(s) (LRB) with comments: FISTULECTOMY ANAL (N/A) - Mucosal Advancement Flap  Patient Location: PACU  Anesthesia Type:General  Level of Consciousness: awake, alert , oriented and patient cooperative  Airway & Oxygen Therapy: Patient Spontanous Breathing and Patient connected to face mask oxygen  Post-op Assessment: Report given to PACU RN, Post -op Vital signs reviewed and stable and Patient moving all extremities X 4  Post vital signs: stable  Complications: No apparent anesthesia complications

## 2012-08-08 ENCOUNTER — Encounter (HOSPITAL_COMMUNITY): Payer: Self-pay | Admitting: General Surgery

## 2012-08-21 ENCOUNTER — Encounter (INDEPENDENT_AMBULATORY_CARE_PROVIDER_SITE_OTHER): Payer: Self-pay | Admitting: General Surgery

## 2012-08-21 ENCOUNTER — Ambulatory Visit (INDEPENDENT_AMBULATORY_CARE_PROVIDER_SITE_OTHER): Payer: BC Managed Care – PPO | Admitting: General Surgery

## 2012-08-21 VITALS — BP 112/70 | HR 76 | Temp 98.7°F | Resp 18 | Ht 68.0 in | Wt 183.6 lb

## 2012-08-21 DIAGNOSIS — N824 Other female intestinal-genital tract fistulae: Secondary | ICD-10-CM

## 2012-08-21 DIAGNOSIS — N823 Fistula of vagina to large intestine: Secondary | ICD-10-CM

## 2012-08-21 NOTE — Patient Instructions (Signed)
You have a recurrence of your rectovaginal fistula.  We will need to let your surgical area heal for a few months.  Continue a high fiber diet.    Your options are several types of muscle flaps, repeat the advancement flap or place a colostomy and perform a larger resection.  I will see you back in a couple months

## 2012-08-21 NOTE — Progress Notes (Signed)
Sheila Spencer is a 51 y.o. female who is status post a MAF on 12/16.  She states that her symptoms returned yesterday with some pain and bleeding.  She is concerned that her fistula has recurred.  Objective: Filed Vitals:   08/21/12 1559  BP: 112/70  Pulse: 76  Temp: 98.7 F (37.1 C)  Resp: 18    General appearance: alert and cooperative GI: soft, non-tender; bowel sounds normal; no masses,  no organomegaly  Incision: dehiscence present 50%   Assessment: s/p  Patient Active Problem List  Diagnosis  . Rectovaginal fistula      Plan:  It appears that her mucosa advancement flap has failed.  We will continue our current regimen for a few months and see if we can get things to heal but I suspect she will need another surgery to fix this.  We discussed different options including redoing the flap, a fistula plug, a diverting ostomy and muscle flaps.  She will consider these options in the meantime.  I will see her back in a couple months or sooner if she has more problems.     Vanita Panda, MD [facility] Surgery, [place] [phone]   08/21/2012 4:31 PM

## 2012-08-30 ENCOUNTER — Encounter (INDEPENDENT_AMBULATORY_CARE_PROVIDER_SITE_OTHER): Payer: BC Managed Care – PPO | Admitting: General Surgery

## 2012-09-12 ENCOUNTER — Ambulatory Visit (INDEPENDENT_AMBULATORY_CARE_PROVIDER_SITE_OTHER): Payer: BC Managed Care – PPO | Admitting: General Surgery

## 2012-09-12 ENCOUNTER — Encounter (INDEPENDENT_AMBULATORY_CARE_PROVIDER_SITE_OTHER): Payer: Self-pay | Admitting: General Surgery

## 2012-09-12 VITALS — BP 130/86 | HR 75 | Temp 97.0°F | Resp 16 | Ht 68.5 in | Wt 190.0 lb

## 2012-09-12 DIAGNOSIS — N823 Fistula of vagina to large intestine: Secondary | ICD-10-CM

## 2012-09-12 DIAGNOSIS — N824 Other female intestinal-genital tract fistulae: Secondary | ICD-10-CM

## 2012-09-12 NOTE — Progress Notes (Signed)
Sheila Spencer is a 52 y.o. female who is here for a follow up visit regarding her rectovaginal fistula.  She states the drainage has decreased and her pain is minimal.  She denies any bleeding.  She states that she didn't take stool softeners after surgery and did have some hard BM's for the 1st few days.  Objective: Filed Vitals:   09/12/12 1025  BP: 130/86  Pulse: 75  Temp: 97 F (36.1 C)  Resp: 16    General appearance: alert and cooperative   Assessment and Plan: Return to office in 4 weeks for recheck.  Hopefully it will continue to heal on its own.  Could do repeat MAF or sphincteroplasty if she still has a fistula.      Vanita Panda, MD [facility], [place] [phone]

## 2012-09-12 NOTE — Addendum Note (Signed)
Addended by: Vanita Panda on: 09/12/2012 10:41 AM   Modules accepted: Level of Service

## 2012-09-12 NOTE — Patient Instructions (Signed)
Ok to return to work

## 2012-10-18 ENCOUNTER — Encounter (INDEPENDENT_AMBULATORY_CARE_PROVIDER_SITE_OTHER): Payer: Self-pay | Admitting: General Surgery

## 2012-10-18 ENCOUNTER — Ambulatory Visit (INDEPENDENT_AMBULATORY_CARE_PROVIDER_SITE_OTHER): Payer: BC Managed Care – PPO | Admitting: General Surgery

## 2012-10-18 VITALS — BP 120/70 | HR 66 | Temp 97.2°F | Resp 18 | Wt 187.0 lb

## 2012-10-18 DIAGNOSIS — N824 Other female intestinal-genital tract fistulae: Secondary | ICD-10-CM

## 2012-10-18 NOTE — Patient Instructions (Signed)
We will schedule your surgery for late March.     [facility] [place] [facility]  ONE-DAY (1) PRE-OP HOME COLON PREP INSTRUCTIONS: ** MIRALAX / GATORADE PREP / FLAGYL**  You must follow the instructions below carefully.  If you have questions or problems, please call and speak to someone in the clinic department at our office:   [phone].     INSTRUCTIONS: 1. Five days prior to your procedure do not eat nuts, popcorn, or fruit with seeds.  Stop all fiber supplements such as Metamucil, Citrucel, etc. 2. Two days before surgery fill the prescription at a pharmacy of your choice and purchase the additional supplies below.         MIRALAX - GATORADE -- DULCOLAX TABS -- FLEET ENEMA:   Purchase a bottle of MIRALAX  (255 gm bottle)    In addition, purchase four (4) DULCOLAX TABLETS (no prescription required- ask the pharmacist if you can't find them)   Purchase one Fleet Enema (Green and white box)    Purchase one 64 oz GATORADE.  (Do NOT purchase red Gatorade; any other flavor is acceptable) and place in refrigerator to get cold.  3.   Day Before Surgery:   6 am: Wash you abdomen with soap and repeat this on the morning of surgery and take 4 Dulcolax tablets   You may only have clear liquids (tea, coffee, juice, broth, jello, soft drinks, gummy bears).  You cannot have solid foods, cream, milk or milk products.  Drink at lease 8 ounces of liquids every hour while awake.   Take the Flagyl prescription as directed at 8 am, 2 pm and 8 pm.  It is helpful to take this with some jello instead of on an empty stomach.  Any flavor is ok, except red jello, which will cause red stools.   Mix the entire bottle of MiraLax and the Gatorade in a large container.    10:00am: Begin drinking the Gatorade mixture until gone (8 oz every 15-30 minutes).      You may suck on a lime wedge or hard candy to "freshen your palate" in between glasses   If you are a diabetic, take your blood sugar reading several time throughout  the prep.  Have some juice available to take if your sugar level gets too low   You may feel chilled while taking the prep.  Have some warm tea or broth to help warm up.   Continue clear liquids until midnight or bedtime  3. The day of your procedure:   Do not eat or drink ANYTHING after midnight before your surgery.     If you take Heart or Blood Pressure medicine, ask the pre-op nurses about these during your preop appointment.   Take a regular Fleet Enema one hour before leaving the come to the hospital.  Try to hold it in 5-10 mins before expelling.   Further pre-operative instructions will be given to you from the hospital.   Expect to be contacted 5-7 days before your surgery.

## 2012-10-18 NOTE — Progress Notes (Signed)
Sheila Spencer is a 52 y.o. female who is status post a rectovaginal fistula repair in mid Dec.  She has recurrence of the fistula, but she feels the the drainage and irritation are less.  Objective: Filed Vitals:   10/18/12 1350  BP: 120/70  Pulse: 66  Temp: 97.2 F (36.2 C)  Resp: 18    General appearance: alert and cooperative GI: soft, non-tender; bowel sounds normal; no masses,  no organomegaly  Incision: fistula noted anteriorly on exam   Assessment: s/p  Patient Active Problem List  Diagnosis  . Rectovaginal fistula    Plan: Recurrent Rectovaginal fistula.  I believe her problem is blood supply.  We discussed all options including plug, redo MAF, LIFT procedure, sphincteroplasty and diverting colostomy.  She has opted to have a sphincteroplasty.  I will perform a rectal [place] prior to the procedure to evaluate her sphincter defect in more detail.  We will plan on doing this in late March.  We discussed the risks of this procedure, which are ~100% wound infection rate, fecal leakage, incontinence, bleeding and recurrence.  I believe she understands these risks and has agreed to proceed.      Vanita Panda, MD [facility] Surgery, [place] [phone]   10/18/2012 2:00 PM

## 2012-11-30 ENCOUNTER — Other Ambulatory Visit (INDEPENDENT_AMBULATORY_CARE_PROVIDER_SITE_OTHER): Payer: Self-pay | Admitting: General Surgery

## 2012-11-30 ENCOUNTER — Encounter: Payer: Self-pay | Admitting: Vascular Surgery

## 2012-11-30 NOTE — Progress Notes (Signed)
Surgery scheduled for 12/14/12.  Preop appointment on 12/11/12 at 200pm.  Need orders in EPIC.  Thanks.

## 2012-12-01 ENCOUNTER — Encounter: Payer: BC Managed Care – PPO | Admitting: Vascular Surgery

## 2012-12-01 ENCOUNTER — Encounter (INDEPENDENT_AMBULATORY_CARE_PROVIDER_SITE_OTHER): Payer: Self-pay

## 2012-12-05 ENCOUNTER — Encounter (HOSPITAL_COMMUNITY): Payer: Self-pay | Admitting: Pharmacy Technician

## 2012-12-11 ENCOUNTER — Encounter (HOSPITAL_COMMUNITY)
Admission: RE | Admit: 2012-12-11 | Discharge: 2012-12-11 | Disposition: A | Discharge status: Home / Self Care | Payer: BC Managed Care – PPO | Source: Ambulatory Visit | Attending: General Surgery | Admitting: General Surgery

## 2012-12-11 ENCOUNTER — Encounter (HOSPITAL_COMMUNITY): Payer: Self-pay

## 2012-12-11 ENCOUNTER — Ambulatory Visit (HOSPITAL_COMMUNITY)
Admission: RE | Admit: 2012-12-11 | Discharge: 2012-12-11 | Disposition: A | Discharge status: Home / Self Care | Payer: BC Managed Care – PPO | Source: Ambulatory Visit | Attending: General Surgery | Admitting: General Surgery

## 2012-12-11 DIAGNOSIS — Z01812 Encounter for preprocedural laboratory examination: Secondary | ICD-10-CM | POA: Insufficient documentation

## 2012-12-11 DIAGNOSIS — Z01818 Encounter for other preprocedural examination: Secondary | ICD-10-CM | POA: Insufficient documentation

## 2012-12-11 DIAGNOSIS — Z90710 Acquired absence of both cervix and uterus: Secondary | ICD-10-CM | POA: Insufficient documentation

## 2012-12-11 DIAGNOSIS — N824 Other female intestinal-genital tract fistulae: Secondary | ICD-10-CM | POA: Insufficient documentation

## 2012-12-11 HISTORY — DX: Other allergy status, other than to drugs and biological substances: Z91.09

## 2012-12-11 LAB — CBC
HCT: 40.2 % (ref 36.0–46.0)
MCHC: 34.3 g/dL (ref 30.0–36.0)
MCV: 89.9 fL (ref 78.0–100.0)
Platelets: 313 10*3/uL (ref 150–400)
RDW: 13.1 % (ref 11.5–15.5)
WBC: 8.5 10*3/uL (ref 4.0–10.5)

## 2012-12-11 NOTE — Progress Notes (Signed)
EKG 12/13 EPIC

## 2012-12-11 NOTE — Patient Instructions (Addendum)
Sheila Spencer  12/11/2012   Your procedure is scheduled on: 12/14/12  THURSDAY   Report to [facility] at   0515    AM.  Call this number if you have problems the morning of surgery: [phone]       Remember:   Do not eat food  After midnight Tuesday NIGHT, BEGIN CLEAR LIQUIDS Wednesday MORNING AS DIRECTED BY OFFICE FOR BOWEL PREP,  Increase fluid intake Wednesday until bedtime or midnight, THEN NOTHING AFTER MIDNIGHT Wednesday NIGHT   Take these medicines the morning of surgery with A SIP OF WATER: ALLEGRA                          SYMBICORT                                             PRO AIR IF NEEDED BRING INHALERS WITH YOU TO HOSPITAL  .  Contacts, dentures or partial plates can not be worn to surgery  Leave suitcase in the car. After surgery it may be brought to your room.  For patients admitted to the hospital, checkout time is 11:00 AM day of  discharge.             SPECIAL INSTRUCTIONS  TAKE TWO DIAL SOAP SHOWERS- one Tuesday NIGHT, ONE WENESDAY NIGHT, REGULAR SOAP FACE AND PRIVATES     DO NOT WEAR JEWELRY, LOTIONS, POWDERS, OR PERFUMES.  WOMEN-- DO NOT SHAVE LEGS OR UNDERARMS FOR 12 HOURS BEFORE SHOWERS. MEN MAY SHAVE FACE.  Patients discharged the day of surgery will not be allowed to drive home. IF going home the day of surgery, you must have a driver and someone to stay with you for the first 24 hours  Name and phone number of your driver:   Husband  Sheila Spencer                                                                       Please read over the following fact sheets that you were given: MRSA Information, Incentive Spirometry Sheet, Blood Transfusion Sheet  Information                                                                                   Sheila Spencer  PST 336  [phone]                 FAILURE TO FOLLOW THESE INSTRUCTIONS MAY RESULT IN  CANCELLATION   OF YOUR SURGERY  Patient Signature  _____________________________

## 2012-12-13 MED ORDER — GENTAMICIN SULFATE 40 MG/ML IJ SOLN
360.0000 mg | INTRAVENOUS | Status: AC
  Administered 2012-12-14: 360 mg via INTRAVENOUS
  Filled 2012-12-13: qty 9

## 2012-12-13 NOTE — Anesthesia Preprocedure Evaluation (Addendum)
Anesthesia Evaluation  Patient identified by MRN, date of birth, ID band Patient awake    Reviewed: Allergy & Precautions, H&P , NPO status , Patient's Chart, lab work & pertinent test results  Airway Mallampati: II TM Distance: >3 FB Neck ROM: full    Dental  (+) Dental Advisory Given and Chipped Left front lateral incissor chipped:   Pulmonary COPD COPD inhaler,  Moderate COPD breath sounds clear to auscultation  Pulmonary exam normal       Cardiovascular Exercise Tolerance: Good negative cardio ROS  Rhythm:regular Rate:Normal     Neuro/Psych Bipolar Disorder Idiopathic peripheral neuropathy negative psych ROS   GI/Hepatic negative GI ROS, Neg liver ROS,   Endo/Other  negative endocrine ROS  Renal/GU negative Renal ROS  negative genitourinary   Musculoskeletal   Abdominal   Peds  Hematology negative hematology ROS (+)   Anesthesia Other Findings   Reproductive/Obstetrics negative OB ROS                          Anesthesia Physical Anesthesia Plan  ASA: III  Anesthesia Plan: General   Post-op Pain Management:    Induction: Intravenous  Airway Management Planned: Oral ETT  Additional Equipment:   Intra-op Plan:   Post-operative Plan: Extubation in OR  Informed Consent: I have reviewed the patients History and Physical, chart, labs and discussed the procedure including the risks, benefits and alternatives for the proposed anesthesia with the patient or authorized representative who has indicated his/her understanding and acceptance.   Dental Advisory Given  Plan Discussed with: CRNA and Surgeon  Anesthesia Plan Comments:         Anesthesia Quick Evaluation

## 2012-12-14 ENCOUNTER — Ambulatory Visit (HOSPITAL_COMMUNITY): Payer: BC Managed Care – PPO | Admitting: Anesthesiology

## 2012-12-14 ENCOUNTER — Encounter (HOSPITAL_COMMUNITY)
Admission: RE | Disposition: A | Discharge status: Home / Self Care | Payer: Self-pay | Source: Ambulatory Visit | Attending: General Surgery

## 2012-12-14 ENCOUNTER — Encounter (HOSPITAL_COMMUNITY): Payer: Self-pay | Admitting: Anesthesiology

## 2012-12-14 ENCOUNTER — Observation Stay (HOSPITAL_COMMUNITY)
Admission: RE | Admit: 2012-12-14 | Discharge: 2012-12-16 | Disposition: A | Discharge status: Home / Self Care | Payer: BC Managed Care – PPO | Source: Ambulatory Visit | Attending: General Surgery | Admitting: General Surgery

## 2012-12-14 ENCOUNTER — Encounter (HOSPITAL_COMMUNITY): Payer: Self-pay | Admitting: *Deleted

## 2012-12-14 DIAGNOSIS — J4489 Other specified chronic obstructive pulmonary disease: Secondary | ICD-10-CM | POA: Insufficient documentation

## 2012-12-14 DIAGNOSIS — N823 Fistula of vagina to large intestine: Secondary | ICD-10-CM | POA: Diagnosis present

## 2012-12-14 DIAGNOSIS — Z79899 Other long term (current) drug therapy: Secondary | ICD-10-CM | POA: Insufficient documentation

## 2012-12-14 DIAGNOSIS — G609 Hereditary and idiopathic neuropathy, unspecified: Secondary | ICD-10-CM | POA: Insufficient documentation

## 2012-12-14 DIAGNOSIS — N824 Other female intestinal-genital tract fistulae: Principal | ICD-10-CM | POA: Insufficient documentation

## 2012-12-14 DIAGNOSIS — F319 Bipolar disorder, unspecified: Secondary | ICD-10-CM | POA: Insufficient documentation

## 2012-12-14 DIAGNOSIS — J449 Chronic obstructive pulmonary disease, unspecified: Secondary | ICD-10-CM | POA: Insufficient documentation

## 2012-12-14 HISTORY — PX: SPHINCTEROTOMY: SHX5279

## 2012-12-14 HISTORY — PX: RECTAL ULTRASOUND: SHX2306

## 2012-12-14 SURGERY — US RECTUM
Anesthesia: General | Site: Rectum | Wound class: Clean Contaminated

## 2012-12-14 MED ORDER — BUPIVACAINE-EPINEPHRINE PF 0.25-1:200000 % IJ SOLN
INTRAMUSCULAR | Status: AC
  Filled 2012-12-14: qty 30

## 2012-12-14 MED ORDER — ONDANSETRON HCL 4 MG/2ML IJ SOLN
INTRAMUSCULAR | Status: DC | PRN
  Administered 2012-12-14: 4 mg via INTRAVENOUS

## 2012-12-14 MED ORDER — HYDROMORPHONE HCL PF 1 MG/ML IJ SOLN
INTRAMUSCULAR | Status: AC
  Filled 2012-12-14: qty 1

## 2012-12-14 MED ORDER — HEPARIN SODIUM (PORCINE) 5000 UNIT/ML IJ SOLN
5000.0000 [IU] | Freq: Three times a day (TID) | INTRAMUSCULAR | Status: DC
  Administered 2012-12-14 – 2012-12-16 (×6): 5000 [IU] via SUBCUTANEOUS
  Filled 2012-12-14 (×9): qty 1

## 2012-12-14 MED ORDER — DOCUSATE SODIUM 100 MG PO CAPS
100.0000 mg | ORAL_CAPSULE | Freq: Three times a day (TID) | ORAL | Status: DC
  Administered 2012-12-14 – 2012-12-16 (×7): 100 mg via ORAL
  Filled 2012-12-14 (×9): qty 1

## 2012-12-14 MED ORDER — LACTATED RINGERS IV SOLN: INTRAVENOUS | Status: DC

## 2012-12-14 MED ORDER — KETOROLAC TROMETHAMINE 15 MG/ML IJ SOLN
15.0000 mg | Freq: Four times a day (QID) | INTRAMUSCULAR | Status: DC
  Administered 2012-12-14 – 2012-12-15 (×5): 15 mg via INTRAVENOUS
  Filled 2012-12-14 (×8): qty 1

## 2012-12-14 MED ORDER — SODIUM CHLORIDE 0.9 % IR SOLN
Status: DC | PRN
  Administered 2012-12-14: 1000 mL

## 2012-12-14 MED ORDER — CLINDAMYCIN PHOSPHATE 900 MG/50ML IV SOLN
INTRAVENOUS | Status: AC
  Filled 2012-12-14: qty 50

## 2012-12-14 MED ORDER — PSYLLIUM 95 % PO PACK
1.0000 | PACK | Freq: Two times a day (BID) | ORAL | Status: DC
  Administered 2012-12-14 – 2012-12-16 (×5): 1 via ORAL
  Filled 2012-12-14 (×6): qty 1

## 2012-12-14 MED ORDER — BUPIVACAINE LIPOSOME 1.3 % IJ SUSP
INTRAMUSCULAR | Status: DC | PRN
  Administered 2012-12-14: 20 mL

## 2012-12-14 MED ORDER — FENTANYL CITRATE 0.05 MG/ML IJ SOLN
INTRAMUSCULAR | Status: DC | PRN
  Administered 2012-12-14 (×2): 50 ug via INTRAVENOUS
  Administered 2012-12-14: 100 ug via INTRAVENOUS
  Administered 2012-12-14: 50 ug via INTRAVENOUS
  Administered 2012-12-14 (×2): 100 ug via INTRAVENOUS
  Administered 2012-12-14: 50 ug via INTRAVENOUS

## 2012-12-14 MED ORDER — MORPHINE SULFATE 2 MG/ML IJ SOLN
2.0000 mg | INTRAMUSCULAR | Status: DC | PRN
Start: 2012-12-14 — End: 2012-12-16

## 2012-12-14 MED ORDER — ALBUTEROL SULFATE HFA 108 (90 BASE) MCG/ACT IN AERS: 2.0000 | INHALATION_SPRAY | Freq: Four times a day (QID) | RESPIRATORY_TRACT | Status: DC | PRN

## 2012-12-14 MED ORDER — CLINDAMYCIN PHOSPHATE 900 MG/50ML IV SOLN
900.0000 mg | INTRAVENOUS | Status: AC
  Administered 2012-12-14: 900 mg via INTRAVENOUS

## 2012-12-14 MED ORDER — ACETAMINOPHEN 10 MG/ML IV SOLN
1000.0000 mg | Freq: Four times a day (QID) | INTRAVENOUS | Status: AC
  Administered 2012-12-14 – 2012-12-15 (×4): 1000 mg via INTRAVENOUS
  Filled 2012-12-14 (×7): qty 100

## 2012-12-14 MED ORDER — ONDANSETRON HCL 4 MG/2ML IJ SOLN: 4.0000 mg | Freq: Four times a day (QID) | INTRAMUSCULAR | Status: DC | PRN

## 2012-12-14 MED ORDER — GLYCOPYRROLATE 0.2 MG/ML IJ SOLN
INTRAMUSCULAR | Status: DC | PRN
  Administered 2012-12-14: .5 mg via INTRAVENOUS

## 2012-12-14 MED ORDER — BUPIVACAINE-EPINEPHRINE (PF) 0.5% -1:200000 IJ SOLN
INTRAMUSCULAR | Status: AC
  Filled 2012-12-14: qty 10

## 2012-12-14 MED ORDER — ACETAMINOPHEN 10 MG/ML IV SOLN
INTRAVENOUS | Status: AC
  Filled 2012-12-14: qty 100

## 2012-12-14 MED ORDER — HYDROMORPHONE HCL PF 1 MG/ML IJ SOLN
0.2500 mg | INTRAMUSCULAR | Status: DC | PRN
  Administered 2012-12-14 (×2): 0.5 mg via INTRAVENOUS

## 2012-12-14 MED ORDER — MIDAZOLAM HCL 5 MG/5ML IJ SOLN
INTRAMUSCULAR | Status: DC | PRN
  Administered 2012-12-14: 2 mg via INTRAVENOUS

## 2012-12-14 MED ORDER — HYDROMORPHONE HCL PF 1 MG/ML IJ SOLN: 0.5000 mg | INTRAMUSCULAR | Status: DC | PRN

## 2012-12-14 MED ORDER — CIPROFLOXACIN IN D5W 400 MG/200ML IV SOLN
400.0000 mg | Freq: Two times a day (BID) | INTRAVENOUS | Status: AC
  Administered 2012-12-14: 400 mg via INTRAVENOUS
  Filled 2012-12-14: qty 200

## 2012-12-14 MED ORDER — BUPIVACAINE LIPOSOME 1.3 % IJ SUSP
20.0000 mL | Freq: Once | INTRAMUSCULAR | Status: DC
  Filled 2012-12-14: qty 20

## 2012-12-14 MED ORDER — ROCURONIUM BROMIDE 100 MG/10ML IV SOLN
INTRAVENOUS | Status: DC | PRN
  Administered 2012-12-14: 50 mg via INTRAVENOUS
  Administered 2012-12-14 (×3): 10 mg via INTRAVENOUS

## 2012-12-14 MED ORDER — BUDESONIDE-FORMOTEROL FUMARATE 80-4.5 MCG/ACT IN AERO
2.0000 | INHALATION_SPRAY | Freq: Two times a day (BID) | RESPIRATORY_TRACT | Status: DC
  Administered 2012-12-15 – 2012-12-16 (×2): 2 via RESPIRATORY_TRACT
  Filled 2012-12-14: qty 6.9

## 2012-12-14 MED ORDER — NEOSTIGMINE METHYLSULFATE 1 MG/ML IJ SOLN
INTRAMUSCULAR | Status: DC | PRN
  Administered 2012-12-14: 3.5 mg via INTRAVENOUS

## 2012-12-14 MED ORDER — LORATADINE 10 MG PO TABS
10.0000 mg | ORAL_TABLET | Freq: Every day | ORAL | Status: DC
  Filled 2012-12-14 (×3): qty 1

## 2012-12-14 MED ORDER — LACTATED RINGERS IV SOLN
INTRAVENOUS | Status: DC | PRN
  Administered 2012-12-14 (×3): via INTRAVENOUS

## 2012-12-14 MED ORDER — METRONIDAZOLE IN NACL 5-0.79 MG/ML-% IV SOLN
500.0000 mg | Freq: Three times a day (TID) | INTRAVENOUS | Status: AC
  Administered 2012-12-14 (×2): 500 mg via INTRAVENOUS
  Filled 2012-12-14 (×2): qty 100

## 2012-12-14 MED ORDER — ONDANSETRON HCL 4 MG PO TABS: 4.0000 mg | ORAL_TABLET | Freq: Four times a day (QID) | ORAL | Status: DC | PRN

## 2012-12-14 MED ORDER — FENTANYL CITRATE 0.05 MG/ML IJ SOLN: 25.0000 ug | INTRAMUSCULAR | Status: DC | PRN

## 2012-12-14 MED ORDER — ACETAMINOPHEN 10 MG/ML IV SOLN
INTRAVENOUS | Status: DC | PRN
  Administered 2012-12-14: 1000 mg via INTRAVENOUS

## 2012-12-14 MED ORDER — ETOMIDATE 2 MG/ML IV SOLN
INTRAVENOUS | Status: DC | PRN
  Administered 2012-12-14: 16 mg via INTRAVENOUS
  Administered 2012-12-14: 4 mg via INTRAVENOUS

## 2012-12-14 MED ORDER — DEXAMETHASONE SODIUM PHOSPHATE 10 MG/ML IJ SOLN
INTRAMUSCULAR | Status: DC | PRN
  Administered 2012-12-14: 5 mg via INTRAVENOUS

## 2012-12-14 MED ORDER — HYDROCODONE-ACETAMINOPHEN 5-325 MG PO TABS
1.0000 | ORAL_TABLET | ORAL | Status: DC | PRN
  Filled 2012-12-14: qty 1

## 2012-12-14 MED ORDER — KCL IN DEXTROSE-NACL 20-5-0.45 MEQ/L-%-% IV SOLN
INTRAVENOUS | Status: DC
  Administered 2012-12-14: 13:00:00 via INTRAVENOUS
  Administered 2012-12-15: 50 mL via INTRAVENOUS
  Filled 2012-12-14 (×2): qty 1000

## 2012-12-14 MED ORDER — LIDOCAINE HCL (PF) 2 % IJ SOLN
INTRAMUSCULAR | Status: DC | PRN
  Administered 2012-12-14: 75 mg

## 2012-12-14 SURGICAL SUPPLY — 50 items
BLADE HEX COATED 2.75 (ELECTRODE) ×2 IMPLANT
BLADE SURG 15 STRL LF DISP TIS (BLADE) ×1 IMPLANT
BLADE SURG 15 STRL SS (BLADE) ×1
CANISTER SUCTION 2500CC (MISCELLANEOUS) ×2 IMPLANT
CLOTH BEACON ORANGE TIMEOUT ST (SAFETY) ×2 IMPLANT
DECANTER SPIKE VIAL GLASS SM (MISCELLANEOUS) ×2 IMPLANT
DRAIN PENROSE 18X1/2 LTX STRL (DRAIN) IMPLANT
DRAPE LG THREE QUARTER DISP (DRAPES) IMPLANT
DRAPE UTILITY W/TAPE 26X15 (DRAPES) ×2 IMPLANT
DRAPE UTILITY XL STRL (DRAPES) ×2 IMPLANT
DRSG PAD ABDOMINAL 8X10 ST (GAUZE/BANDAGES/DRESSINGS) IMPLANT
ELECT REM PT RETURN 9FT ADLT (ELECTROSURGICAL) ×2
ELECTRODE REM PT RTRN 9FT ADLT (ELECTROSURGICAL) ×1 IMPLANT
GAUZE SPONGE 4X4 16PLY XRAY LF (GAUZE/BANDAGES/DRESSINGS) ×4 IMPLANT
GAUZE VASELINE 3X9 (GAUZE/BANDAGES/DRESSINGS) IMPLANT
GLOVE BIO SURGEON STRL SZ 6.5 (GLOVE) ×2 IMPLANT
GLOVE BIOGEL PI IND STRL 7.0 (GLOVE) ×1 IMPLANT
GLOVE BIOGEL PI INDICATOR 7.0 (GLOVE) ×1
GOWN BRE IMP PREV XXLGXLNG (GOWN DISPOSABLE) ×2 IMPLANT
GOWN PREVENTION PLUS LG XLONG (DISPOSABLE) ×2 IMPLANT
GOWN STRL REIN XL XLG (GOWN DISPOSABLE) ×4 IMPLANT
HOOK RETRACTION 12 ELAST STAY (MISCELLANEOUS) ×2 IMPLANT
KIT BASIN OR (CUSTOM PROCEDURE TRAY) ×2 IMPLANT
LUBRICANT JELLY K Y 4OZ (MISCELLANEOUS) ×2 IMPLANT
NEEDLE HYPO 25X1 1.5 SAFETY (NEEDLE) ×2 IMPLANT
NS IRRIG 1000ML POUR BTL (IV SOLUTION) ×2 IMPLANT
PACK BASIC VI WITH GOWN DISP (CUSTOM PROCEDURE TRAY) ×2 IMPLANT
PACK LITHOTOMY IV (CUSTOM PROCEDURE TRAY) ×2 IMPLANT
PAD ABD 7.5X8 STRL (GAUZE/BANDAGES/DRESSINGS) ×2 IMPLANT
PENCIL BUTTON HOLSTER BLD 10FT (ELECTRODE) ×2 IMPLANT
RETRACTOR LONE STAR DISPOSABLE (INSTRUMENTS) ×2 IMPLANT
SPONGE GAUZE 4X4 12PLY (GAUZE/BANDAGES/DRESSINGS) ×2 IMPLANT
SPONGE SURGIFOAM ABS GEL 100 (HEMOSTASIS) IMPLANT
SPONGE SURGIFOAM ABS GEL 12-7 (HEMOSTASIS) IMPLANT
SUT CHROMIC 2 0 SH (SUTURE) IMPLANT
SUT CHROMIC 3 0 SH 27 (SUTURE) IMPLANT
SUT MON AB 3-0 SH 27 (SUTURE)
SUT MON AB 3-0 SH27 (SUTURE) IMPLANT
SUT NOVA NAB GS-22 2 0 T19 (SUTURE) ×6 IMPLANT
SUT PROLENE 2 0 BLUE (SUTURE) ×4 IMPLANT
SUT VIC AB 2-0 SH 18 (SUTURE) ×6 IMPLANT
SUT VIC AB 3-0 SH 8-18 (SUTURE) ×4 IMPLANT
SUT VIC AB 4-0 P-3 18XBRD (SUTURE) IMPLANT
SUT VIC AB 4-0 P3 18 (SUTURE)
SYR BULB IRRIGATION 50ML (SYRINGE) ×2 IMPLANT
SYR CONTROL 10ML LL (SYRINGE) ×2 IMPLANT
TOWEL OR 17X26 10 PK STRL BLUE (TOWEL DISPOSABLE) ×2 IMPLANT
UNDERPAD 30X30 INCONTINENT (UNDERPADS AND DIAPERS) ×2 IMPLANT
WATER STERILE IRR 1500ML POUR (IV SOLUTION) IMPLANT
YANKAUER SUCT BULB TIP 10FT TU (MISCELLANEOUS) ×2 IMPLANT
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
## 2012-12-14 NOTE — Anesthesia Postprocedure Evaluation (Signed)
  Anesthesia Post-op Note  Patient: Sheila Spencer  Procedure(s) Performed: Procedure(s) (LRB): RECTAL ULTRASOUND (N/A) Overlapping Sphincter repair, RECTO VAGINAL FISTULA  REPAIR  (N/A)  Patient Location: PACU  Anesthesia Type: General  Level of Consciousness: awake and alert   Airway and Oxygen Therapy: Patient Spontanous Breathing  Post-op Pain: mild  Post-op Assessment: Post-op Vital signs reviewed, Patient's Cardiovascular Status Stable, Respiratory Function Stable, Patent Airway and No signs of Nausea or vomiting  Last Vitals:  Filed Vitals:   12/14/12 1100  BP: 125/63  Pulse: 62  Temp: 36.6 C  Resp: 17    Post-op Vital Signs: stable   Complications: No apparent anesthesia complications

## 2012-12-14 NOTE — Op Note (Signed)
12/14/2012  10:39 AM  PATIENT:  Sheila Spencer  52 y.o. female  Patient Care Team: Gretel Acre, MD as PCP - General (Family Medicine)  PRE-OPERATIVE DIAGNOSIS:  rectovaginal fistula recurrent  POST-OPERATIVE DIAGNOSIS:  RECTOVAGINAL FISTULA   PROCEDURE:  Procedure(s): RECTAL ULTRASOUND Overlapping Sphincter repair, RECTO VAGINAL FISTULA  REPAIR   SURGEON:  Surgeon(s): Romie Levee, MD Ardeth Sportsman, MD  ASSISTANT: Michaell Cowing   ANESTHESIA:   local and general  EBL:  Total I/O In: 2100 [I.V.:2100] Out: 300 [Urine:100; Blood:200]  Delay start of Pharmacological VTE agent (>24hrs) due to surgical blood loss or risk of bleeding:  no  DRAINS: Penrose drain in the wound   SPECIMEN:  Source of Specimen:  rectovaginal fistula  DISPOSITION OF SPECIMEN:  PATHOLOGY  COUNTS:  YES  PLAN OF CARE: Admit to inpatient   PATIENT DISPOSITION:  PACU - hemodynamically stable.  INDICATION: this is a 52 year old female who has a long-standing history of a rectovaginal fistula. She has failed a transvaginal and transanal repair in the past. She now presents with a recurrent fistula. We have discussed doing a rectal ultrasound in the operating room and in the external sphincter is compromised, we would go ahead with a overlapping sphincter repair. If the sphincter was intact we would perform a lift procedure.  OR FINDINGS: large rectovaginal fistula, no masses, small posterior intersphincteric abscess  DESCRIPTION: The patient was identified in the preoperative holding area and taken to the OR where they were laid prone on the operating room table after general anesthesia was smoothly induced and a Foley catheter was placed under sterile conditions.  The patient was then prepped and draped in the usual sterile fashion. A surgical timeout was performed indicating the correct patient, procedure, positioning and preoperative antibiotics. SCDs were noted to be in place and functioning prior to  the operation.  I began by performing a rectal ultrasound the levator muscles were identified. The probe was then withdrawn and the internal sphincter was identified. This appeared to be intact. The external sphincter was identified in its normal anatomic position. There was approximately a 140 separation noted in the anterior sphincter wall. A small cavity was noted posteriorly in the intersphincteric space.  Since she did indeed have a sphincter defect, we decided to proceed with an overlapping sphincter repair. An incision was made in the perineal body using a 15 blade scalpel. Subcutaneous flaps were raised to the level of the sphincters posteriorly and to the vaginal canal anteriorly. The external sphincter was identified. Dissection was started laterally into the issue of rectal fascia. Once this was identified the remaining portion of the sphincter was dissected free from the scar tissue. This was done bilaterally until the sphincters were mobilized to good healthy muscle which contracted upon application of Bovie electrocautery. We did encounter the intersphincteric abscess in the posterior lateral portion of the left-sided dissection. This was opened as we went. Anteriorly the bulbocavernosus muscle was separated from the sphincters. This was dissected free from surrounding tissues. After this was completed and all the muscle tissue was dissected free from the scar, I began to resect the middle portion of the rectovaginal fistula. I separated out the rectal wall from the vaginal wall using sharp dissection. The fistula was transected. The fistula edges were removed using Sharp dissection. These were sent to pathology for further evaluation.  After this was completed the vaginal opening was closed using interrupted 3-0 Vicryl sutures. The rectal opening was also closed transversely using interrupted  2-0 Vicryl sutures. 2-0 Vicryl sutures were then used to close the levators.  Once this was completed the  bulbocavernosus muscle was brought over the vaginal repair and sutured into place using 2-0 Vicryl sutures. I then used Prolene mattress sutures to perform an overlapping sphincter repair which was closed to a diameter of 1 fingerbreadth.  After this was completed the issue of rectal fat was mobilized into the space between the 2 muscle repairs.  This was held in place with some loosely placed 2-0 Vicryl sutures. I then closed the incision using interrupted 2-0 Vicryl subdermal sutures. 2-0 Novafil mattress sutures were used to close the lateral edges. The cavity was irrigated with normal saline prior to our muscle closure and then again at this point.  The portion of the incision at midline was left open and a Penrose drain was left inside. This was sutured to the skin using the Novafil suture. 30 mL of subcutaneous Exparel was used for postoperative pain control. Sterile fluffs and a sterile dressing were then applied.  The patient was awakened from anesthesia and sent to the postanesthesia care unit in stable condition. All counts were correct per operating room staff.

## 2012-12-14 NOTE — Transfer of Care (Signed)
Immediate Anesthesia Transfer of Care Note  Patient: Sheila Spencer  Procedure(s) Performed: Procedure(s): RECTAL ULTRASOUND (N/A) Overlapping Sphincter repair, RECTO VAGINAL FISTULA  REPAIR  (N/A)  Patient Location: PACU  Anesthesia Type:General  Level of Consciousness: awake, alert  and oriented  Airway & Oxygen Therapy: Patient Spontanous Breathing and Patient connected to face mask oxygen  Post-op Assessment: Report given to PACU RN, Post -op Vital signs reviewed and stable and Patient moving all extremities X 4  Post vital signs: Reviewed and stable  Complications: No apparent anesthesia complications

## 2012-12-15 ENCOUNTER — Encounter (HOSPITAL_COMMUNITY): Payer: Self-pay | Admitting: General Surgery

## 2012-12-15 LAB — BASIC METABOLIC PANEL WITH GFR
BUN: 10 mg/dL (ref 6–23)
CO2: 29 meq/L (ref 19–32)
Calcium: 8.8 mg/dL (ref 8.4–10.5)
Chloride: 105 meq/L (ref 96–112)
Creatinine, Ser: 0.71 mg/dL (ref 0.50–1.10)
GFR calc Af Amer: >90 mL/min
GFR calc non Af Amer: >90 mL/min
Glucose, Bld: 92 mg/dL (ref 70–99)
Potassium: 4.3 meq/L (ref 3.5–5.1)
Sodium: 140 meq/L (ref 135–145)

## 2012-12-15 LAB — CBC
HCT: 36.5 % (ref 36.0–46.0)
Hemoglobin: 12.2 g/dL (ref 12.0–15.0)
MCH: 30.9 pg (ref 26.0–34.0)
MCHC: 33.4 g/dL (ref 30.0–36.0)
MCV: 92.4 fL (ref 78.0–100.0)
Platelets: 226 K/uL (ref 150–400)
RBC: 3.95 MIL/uL (ref 3.87–5.11)
RDW: 13.1 % (ref 11.5–15.5)
WBC: 8.2 K/uL (ref 4.0–10.5)

## 2012-12-15 MED ORDER — DSS 100 MG PO CAPS: 100.0000 mg | ORAL_CAPSULE | Freq: Three times a day (TID) | ORAL | Status: DC

## 2012-12-15 MED ORDER — PSYLLIUM 95 % PO PACK: 1.0000 | PACK | Freq: Two times a day (BID) | ORAL | Status: DC

## 2012-12-15 MED ORDER — MELOXICAM 15 MG PO TABS
15.0000 mg | ORAL_TABLET | Freq: Every day | ORAL | Status: DC
  Administered 2012-12-16: 15 mg via ORAL
  Filled 2012-12-15: qty 1

## 2012-12-15 MED ORDER — SODIUM CHLORIDE 0.9 % IJ SOLN: 3.0000 mL | INTRAMUSCULAR | Status: DC | PRN

## 2012-12-15 MED ORDER — SODIUM CHLORIDE 0.9 % IJ SOLN
3.0000 mL | Freq: Two times a day (BID) | INTRAMUSCULAR | Status: DC
  Administered 2012-12-15: 3 mL via INTRAVENOUS

## 2012-12-15 MED ORDER — HYDROCODONE-ACETAMINOPHEN 5-325 MG PO TABS: 1.0000 | ORAL_TABLET | ORAL | Status: DC | PRN

## 2012-12-15 NOTE — Progress Notes (Signed)
Spoke to [employer], [place] informed him per Dr. Maisie Fus patient must have BM for discharge, but is on TID colace and BID metamucil, no BM had surgery yesterday and Dr. Maisie Fus note states miralax 48hrs after surgery, Will states will not start miralax now. Informed him that i told patient she could not take meds from home, but states took meloxicam and patient is also on scheduled toradol, Will states to speak with patient if she would rather have meloxicam she can but toradol must be discontinued.

## 2012-12-15 NOTE — Progress Notes (Signed)
1 Day Post-Op overlapping sphincter repair for recurrent rectovaginal fistula Subjective: Pt doing well.  No complaints of pain.    Objective: Vital signs in last 24 hours: Temp:  [97.6 F (36.4 C)-98.3 F (36.8 C)] 98 F (36.7 C) (04/25 0552) Pulse Rate:  [57-89] 58 (04/25 0552) Resp:  [8-20] 20 (04/25 0552) BP: (100-135)/(48-79) 100/61 mmHg (04/25 0552) SpO2:  [96 %-100 %] 98 % (04/25 0552) Weight:  [189 lb (85.73 kg)] 189 lb (85.73 kg) (04/24 1115)   Intake/Output from previous day: 04/24 0701 - 04/25 0700 In: 4490 [P.O.:1200; I.V.:3290] Out: 3690 [Urine:3490; Blood:200] Intake/Output this shift:     General appearance: alert and cooperative GI: soft, non-tender; bowel sounds normal; no masses,  no organomegaly  Incision: slight clear drainage present, penrose drain in place  Lab Results:   Recent Labs  12/15/12 0505  WBC 8.2  HGB 12.2  HCT 36.5  PLT 226   BMET  Recent Labs  12/15/12 0505  NA 140  K 4.3  CL 105  CO2 29  GLUCOSE 92  BUN 10  CREATININE 0.71  CALCIUM 8.8   PT/INR No results found for this basename: LABPROT, INR,  in the last 72 hours ABG No results found for this basename: PHART, PCO2, PO2, HCO3,  in the last 72 hours  MEDS, Scheduled . budesonide-formoterol  2 puff Inhalation BID  . docusate sodium  100 mg Oral TID  . heparin  5,000 Units Subcutaneous Q8H  . ketorolac  15 mg Intravenous Q6H  . loratadine  10 mg Oral Daily  . psyllium  1 packet Oral BID  . sodium chloride  3 mL Intravenous Q12H    Studies/Results: No results found.  Assessment: s/p Procedure(s): RECTAL ULTRASOUND Overlapping Sphincter repair, RECTO VAGINAL FISTULA  REPAIR  Patient Active Problem List  Diagnosis  . Rectovaginal fistula    Expected post op course  Plan: d/c foley Saline lock IV Continue fiber BID and Colace TID.  Will add Miralax if no BM in 48h D/C home once pt has a BM and pain controlled Leave penrose in place for now   LOS: 1  day     .Vanita Panda, MD [facility], [place] [phone]   12/15/2012 7:29 AM

## 2012-12-15 NOTE — Progress Notes (Signed)
Informed patient she cannot take meds from home. States understanding

## 2012-12-16 NOTE — Progress Notes (Signed)
She just had a BM.  Will discharge.  Instructions given to her.

## 2012-12-16 NOTE — Progress Notes (Signed)
2 Days Post-Op  Subjective: No pain.  No BM yet.  Passing flatus.  Objective: Vital signs in last 24 hours: Temp:  [97.9 F (36.6 C)-98.6 F (37 C)] 98.6 F (37 C) (04/26 0545) Pulse Rate:  [57-91] 83 (04/26 0545) Resp:  [16-18] 18 (04/26 0545) BP: (102-121)/(50-69) 121/52 mmHg (04/26 0545) SpO2:  [95 %-99 %] 96 % (04/26 0545) Last BM Date: 12/14/12  Intake/Output from previous day: 04/25 0701 - 04/26 0700 In: 40 [I.V.:40] Out: 4100 [Urine:4100] Intake/Output this shift:    PE: General- In NAD Abdomen-soft, nontender Anorectal-no bleeding  Lab Results:   Recent Labs  12/15/12 0505  WBC 8.2  HGB 12.2  HCT 36.5  PLT 226   BMET  Recent Labs  12/15/12 0505  NA 140  K 4.3  CL 105  CO2 29  GLUCOSE 92  BUN 10  CREATININE 0.71  CALCIUM 8.8   PT/INR No results found for this basename: LABPROT, INR,  in the last 72 hours Comprehensive Metabolic Panel:    Component Value Date/Time   NA 140 12/15/2012 0505   K 4.3 12/15/2012 0505   CL 105 12/15/2012 0505   CO2 29 12/15/2012 0505   BUN 10 12/15/2012 0505   CREATININE 0.71 12/15/2012 0505   GLUCOSE 92 12/15/2012 0505   CALCIUM 8.8 12/15/2012 0505     Studies/Results: No results found.  Anti-infectives: Anti-infectives   Start     Dose/Rate Route Frequency Ordered Stop   12/14/12 1200  metroNIDAZOLE (FLAGYL) IVPB 500 mg     500 mg 100 mL/hr over 60 Minutes Intravenous Every 8 hours 12/14/12 1122 12/14/12 2037   12/14/12 1200  ciprofloxacin (CIPRO) IVPB 400 mg     400 mg 200 mL/hr over 60 Minutes Intravenous Every 12 hours 12/14/12 1122 12/14/12 1345   12/14/12 0513  clindamycin (CLEOCIN) IVPB 900 mg     900 mg 100 mL/hr over 30 Minutes Intravenous 60 min pre-op 12/14/12 0513 12/14/12 0746   12/13/12 1649  gentamicin (GARAMYCIN) 360 mg in dextrose 5 % 100 mL IVPB     360 mg 109 mL/hr over 60 Minutes Intravenous 60 min pre-op 12/13/12 1649 12/14/12 0831      Assessment Active Problems:   Rectovaginal  fistula s/p repair 12/14/12-progressing well; no BM yet.    LOS: 2 days   Plan: Discharge once she has a BM.  Start Miralax tomorrow if no BM.   Monea Pesantez J 12/16/2012

## 2012-12-16 NOTE — Discharge Summary (Signed)
Physician Discharge Summary  Patient ID: Sheila Spencer MRN: [id number] DOB/AGE: February 03, 1961 52 y.o.  Admit date: 12/14/2012 Discharge date: 12/16/2012  Admission Diagnoses:  Rectovaginal fistula  Discharge Diagnoses:  Active Problems:   Rectovaginal fistula   Discharged Condition: good  Hospital Course: She underwent rectovaginal fistula repair 12/14/2012. She has some soreness and small amount of bleeding on postoperative day #1 by postoperative day #2, she had no pain, she had no bleeding, and she had a bowel movement. She was ready for discharge. She was given discharge instructions.  Consults: None  Significant Diagnostic Studies: none   Treatments: surgery: Rectovaginal fistula repair 12/14/2012  Discharge Exam: Blood pressure 121/52, pulse 83, temperature 98.6 F (37 C), temperature source Oral, resp. rate 18, height 5\' 8"  (1.727 m), weight 189 lb (85.73 kg), SpO2 96.00%.   Disposition: 01-Home or Self Care   Future Appointments Provider Department Dept Phone   01/05/2013 2:00 PM Romie Levee, MD [facility], [place] [phone]       Medication List    TAKE these medications       albuterol 108 (90 BASE) MCG/ACT inhaler  Commonly known as:  PROVENTIL HFA;VENTOLIN HFA  Inhale 2 puffs into the lungs every 6 (six) hours as needed for wheezing.     budesonide-formoterol 80-4.5 MCG/ACT inhaler  Commonly known as:  SYMBICORT  Inhale 2 puffs into the lungs 2 (two) times daily.     CHILDRENS MULTIVITAMINS PO  Take 1 tablet by mouth daily.     docusate sodium 100 MG capsule  Commonly known as:  COLACE  Take 1 capsule (100 mg total) by mouth 2 (two) times daily.     DSS 100 MG Caps  Commonly known as:  COLACE  Take 100 mg by mouth 3 (three) times daily.     fexofenadine 180 MG tablet  Commonly known as:  ALLEGRA  Take 180 mg by mouth daily.     HYDROcodone-acetaminophen 5-325 MG per tablet  Commonly known as:  NORCO/VICODIN  Take 1-2 tablets by  mouth every 4 (four) hours as needed.     meloxicam 15 MG tablet  Commonly known as:  MOBIC  Take 15 mg by mouth daily.     oxyCODONE 5 MG immediate release tablet  Commonly known as:  Oxy IR/ROXICODONE  Take 15 mg by mouth every 6 (six) hours as needed (for foot pain).     psyllium 95 % Pack  Commonly known as:  HYDROCIL/METAMUCIL  Take 1 packet by mouth 2 (two) times daily.     pyridOXINE 100 MG tablet  Commonly known as:  VITAMIN B-6  Take 200 mg by mouth daily.     vitamin B-12 1000 MCG tablet  Commonly known as:  CYANOCOBALAMIN  Take 1,000 mcg by mouth daily.     vitamin C 500 MG tablet  Commonly known as:  ASCORBIC ACID  Take 500 mg by mouth daily.         Signed: Adolph Pollack 12/16/2012, 8:24 AM

## 2012-12-18 ENCOUNTER — Telehealth (INDEPENDENT_AMBULATORY_CARE_PROVIDER_SITE_OTHER): Payer: Self-pay | Admitting: General Surgery

## 2012-12-18 NOTE — Telephone Encounter (Signed)
Called to check on Pt post op.  No answer at home or cell #.  Left message to call if she is having any issues.

## 2012-12-20 ENCOUNTER — Telehealth (INDEPENDENT_AMBULATORY_CARE_PROVIDER_SITE_OTHER): Payer: Self-pay | Admitting: General Surgery

## 2012-12-20 NOTE — Telephone Encounter (Signed)
Called to check on Pt.  Having small, soft BM about every other day.  Sounds like she is having some sphincter spasm.  Encouraged her to use sitz baths for this.  She will call the office if things change.

## 2013-01-05 ENCOUNTER — Ambulatory Visit (INDEPENDENT_AMBULATORY_CARE_PROVIDER_SITE_OTHER): Payer: BC Managed Care – PPO | Admitting: General Surgery

## 2013-01-05 ENCOUNTER — Encounter (INDEPENDENT_AMBULATORY_CARE_PROVIDER_SITE_OTHER): Payer: Self-pay | Admitting: General Surgery

## 2013-01-05 VITALS — BP 128/82 | HR 84 | Temp 98.0°F | Resp 18 | Wt 187.0 lb

## 2013-01-05 DIAGNOSIS — N824 Other female intestinal-genital tract fistulae: Secondary | ICD-10-CM

## 2013-01-05 DIAGNOSIS — N823 Fistula of vagina to large intestine: Secondary | ICD-10-CM

## 2013-01-05 NOTE — Patient Instructions (Signed)
Continue current management.  I will see you back in 4 weeks

## 2013-01-05 NOTE — Progress Notes (Signed)
CHESTINA KOMATSU is a 52 y.o. female who is status post an overlapping sphincter repair ~3 weeks ago.  She is doing well.  She is off narcotics.  She is having regular soft BM's.  She is doing light activities.  She denies any leakage or incontinence.  Objective: Filed Vitals:   01/05/13 1359  BP: 128/82  Pulse: 84  Temp: 98 F (36.7 C)  Resp: 18    General appearance: alert and cooperative GI: normal findings: soft, non-tender  Incision: healing well, good granulation tissue noted   Assessment: s/p  Patient Active Problem List   Diagnosis Date Noted  . Rectovaginal fistula 07/04/2012    Plan: RTO in 4 weeks.  Continue light activity    .Vanita Panda, MD [facility], [place] [phone]   01/05/2013 2:05 PM

## 2013-02-02 ENCOUNTER — Encounter (INDEPENDENT_AMBULATORY_CARE_PROVIDER_SITE_OTHER): Payer: Self-pay | Admitting: General Surgery

## 2013-02-02 ENCOUNTER — Encounter (INDEPENDENT_AMBULATORY_CARE_PROVIDER_SITE_OTHER): Payer: Self-pay

## 2013-02-02 ENCOUNTER — Ambulatory Visit (INDEPENDENT_AMBULATORY_CARE_PROVIDER_SITE_OTHER): Payer: BC Managed Care – PPO | Admitting: General Surgery

## 2013-02-02 VITALS — BP 130/90 | HR 88 | Temp 97.8°F | Resp 18 | Ht 68.0 in | Wt 189.9 lb

## 2013-02-02 DIAGNOSIS — Z9889 Other specified postprocedural states: Secondary | ICD-10-CM

## 2013-02-02 NOTE — Patient Instructions (Signed)
Continue to work on having regular bowel movements.  Try to avoid straining for a few more weeks.

## 2013-02-02 NOTE — Progress Notes (Signed)
CARRIANNE HYUN is a 52 y.o. female who is status post an overlapping sphincter repair ~6 weeks ago. She is doing well. She is off narcotics. She is having regular soft BM's. She is doing light activities. She denies any leakage or incontinence except for some light spotting. She has no vaginal drainage   Objective:  Filed Vitals:   02/02/13 1628  BP: 130/90  Pulse: 88  Temp: 97.8 F (36.6 C)  Resp: 18   General appearance: alert and cooperative  GI: normal findings: soft, non-tender  Incision: healing well, good granulation tissue noted anteriorly, no sign of fistula, good squeeze posteriorly  Assessment:  s/p  Patient Active Problem List    Diagnosis  Date Noted   .  Rectovaginal fistula  07/04/2012    Plan:  RTO in 4 weeks. Continue light activity for another 2 weeks.  Ok to go back to work after that.  I will see her back in 4 weeks.

## 2013-03-02 ENCOUNTER — Encounter (INDEPENDENT_AMBULATORY_CARE_PROVIDER_SITE_OTHER): Payer: BC Managed Care – PPO | Admitting: General Surgery

## 2013-05-11 ENCOUNTER — Ambulatory Visit (INDEPENDENT_AMBULATORY_CARE_PROVIDER_SITE_OTHER): Payer: BC Managed Care – PPO | Admitting: General Surgery

## 2013-05-11 ENCOUNTER — Encounter (INDEPENDENT_AMBULATORY_CARE_PROVIDER_SITE_OTHER): Payer: Self-pay | Admitting: General Surgery

## 2013-05-11 VITALS — BP 122/68 | HR 84 | Temp 98.3°F | Resp 15 | Ht 68.0 in | Wt 188.0 lb

## 2013-05-11 DIAGNOSIS — Z9889 Other specified postprocedural states: Secondary | ICD-10-CM

## 2013-05-11 NOTE — Progress Notes (Signed)
Sheila Spencer is a 52 y.o. female who is here for a follow up visit regarding her RVF.  She is /sp sphincteroplasty in early May.  She denies pain or incontinence.  She is having regular BM's.  She occasionally has some drainage.  Objective: Filed Vitals:   05/11/13 1055  BP: 122/68  Pulse: 84  Temp: 98.3 F (36.8 C)  Resp: 15    General appearance: alert and cooperative anal canal: healing well, good tone and squeeze   Assessment and Plan: She seems to have healed well.  I will see her back as needed    .Vanita Panda, MD [facility], [place] [phone]

## 2013-05-11 NOTE — Patient Instructions (Signed)
Call us if you have any concerns

## 2014-02-22 ENCOUNTER — Emergency Department (HOSPITAL_COMMUNITY): Payer: BC Managed Care – PPO

## 2014-02-22 ENCOUNTER — Emergency Department (HOSPITAL_COMMUNITY)
Admission: EM | Admit: 2014-02-22 | Discharge: 2014-02-22 | Disposition: A | Discharge status: Home / Self Care | Payer: BC Managed Care – PPO | Attending: Emergency Medicine | Admitting: Emergency Medicine

## 2014-02-22 ENCOUNTER — Encounter (HOSPITAL_COMMUNITY): Payer: Self-pay | Admitting: *Deleted

## 2014-02-22 DIAGNOSIS — Z8669 Personal history of other diseases of the nervous system and sense organs: Secondary | ICD-10-CM | POA: Diagnosis not present

## 2014-02-22 DIAGNOSIS — Z8679 Personal history of other diseases of the circulatory system: Secondary | ICD-10-CM | POA: Diagnosis not present

## 2014-02-22 DIAGNOSIS — Y92410 Unspecified street and highway as the place of occurrence of the external cause: Secondary | ICD-10-CM | POA: Diagnosis not present

## 2014-02-22 DIAGNOSIS — N189 Chronic kidney disease, unspecified: Secondary | ICD-10-CM | POA: Insufficient documentation

## 2014-02-22 DIAGNOSIS — IMO0002 Reserved for concepts with insufficient information to code with codable children: Secondary | ICD-10-CM | POA: Insufficient documentation

## 2014-02-22 DIAGNOSIS — Y9389 Activity, other specified: Secondary | ICD-10-CM | POA: Insufficient documentation

## 2014-02-22 DIAGNOSIS — S79929A Unspecified injury of unspecified thigh, initial encounter: Secondary | ICD-10-CM | POA: Diagnosis not present

## 2014-02-22 DIAGNOSIS — Z87891 Personal history of nicotine dependence: Secondary | ICD-10-CM | POA: Diagnosis not present

## 2014-02-22 DIAGNOSIS — S79919A Unspecified injury of unspecified hip, initial encounter: Secondary | ICD-10-CM | POA: Diagnosis not present

## 2014-02-22 DIAGNOSIS — Z79899 Other long term (current) drug therapy: Secondary | ICD-10-CM | POA: Diagnosis not present

## 2014-02-22 DIAGNOSIS — Z791 Long term (current) use of non-steroidal anti-inflammatories (NSAID): Secondary | ICD-10-CM | POA: Insufficient documentation

## 2014-02-22 DIAGNOSIS — R42 Dizziness and giddiness: Secondary | ICD-10-CM | POA: Insufficient documentation

## 2014-02-22 DIAGNOSIS — Z880 Allergy status to penicillin: Secondary | ICD-10-CM | POA: Insufficient documentation

## 2014-02-22 DIAGNOSIS — Z8659 Personal history of other mental and behavioral disorders: Secondary | ICD-10-CM | POA: Insufficient documentation

## 2014-02-22 DIAGNOSIS — J449 Chronic obstructive pulmonary disease, unspecified: Secondary | ICD-10-CM | POA: Diagnosis not present

## 2014-02-22 DIAGNOSIS — J4489 Other specified chronic obstructive pulmonary disease: Secondary | ICD-10-CM | POA: Insufficient documentation

## 2014-02-22 MED ORDER — KETOROLAC TROMETHAMINE 30 MG/ML IJ SOLN
30.0000 mg | Freq: Once | INTRAMUSCULAR | Status: AC
  Administered 2014-02-22: 30 mg via INTRAMUSCULAR
  Filled 2014-02-22: qty 1

## 2014-02-22 MED ORDER — PROCHLORPERAZINE MALEATE 10 MG PO TABS
10.0000 mg | ORAL_TABLET | Freq: Once | ORAL | Status: AC
  Administered 2014-02-22: 10 mg via ORAL
  Filled 2014-02-22: qty 1

## 2014-02-22 MED ORDER — PROMETHAZINE HCL 12.5 MG PO TABS
12.5000 mg | ORAL_TABLET | Freq: Once | ORAL | Status: AC
  Administered 2014-02-22: 12.5 mg via ORAL
  Filled 2014-02-22: qty 1
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
## 2014-02-22 NOTE — ED Notes (Signed)
PA at bedside.
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
## 2014-02-25 ENCOUNTER — Ambulatory Visit
Admission: RE | Admit: 2014-02-25 | Discharge: 2014-02-25 | Disposition: A | Discharge status: Home / Self Care | Payer: BC Managed Care – PPO | Source: Ambulatory Visit | Attending: Family Medicine | Admitting: Family Medicine

## 2014-02-25 ENCOUNTER — Other Ambulatory Visit: Payer: Self-pay | Admitting: Family Medicine

## 2014-03-04 ENCOUNTER — Ambulatory Visit
Admission: RE | Admit: 2014-03-04 | Discharge: 2014-03-04 | Disposition: A | Discharge status: Home / Self Care | Payer: BC Managed Care – PPO | Source: Ambulatory Visit | Attending: Family Medicine | Admitting: Family Medicine

## 2014-03-04 ENCOUNTER — Other Ambulatory Visit: Payer: Self-pay | Admitting: Family Medicine

## 2014-03-04 DIAGNOSIS — R52 Pain, unspecified: Secondary | ICD-10-CM

## 2014-03-04 NOTE — ED Provider Notes (Signed)
Medical screening examination/treatment/procedure(s) were performed by non-physician practitioner and as supervising physician I was immediately available for consultation/collaboration.   EKG Interpretation None        Elyn Peers, MD 03/04/14 [phone]

## 2020-07-23 ENCOUNTER — Institutional Professional Consult (permissible substitution): Admitting: Pulmonary Disease

## 2020-08-12 ENCOUNTER — Encounter: Payer: Self-pay | Admitting: Pulmonary Disease

## 2020-08-13 ENCOUNTER — Encounter: Payer: Self-pay | Admitting: Pulmonary Disease

## 2020-08-13 ENCOUNTER — Other Ambulatory Visit: Payer: Self-pay

## 2020-08-13 ENCOUNTER — Ambulatory Visit (INDEPENDENT_AMBULATORY_CARE_PROVIDER_SITE_OTHER): Payer: BC Managed Care – PPO | Admitting: Pulmonary Disease

## 2020-08-13 VITALS — BP 116/72 | HR 88 | Temp 97.8°F | Ht 68.0 in | Wt 203.0 lb

## 2020-08-13 DIAGNOSIS — L309 Dermatitis, unspecified: Secondary | ICD-10-CM

## 2020-08-13 DIAGNOSIS — J4520 Mild intermittent asthma, uncomplicated: Secondary | ICD-10-CM | POA: Diagnosis not present

## 2020-08-13 MED ORDER — BUDESONIDE-FORMOTEROL FUMARATE 160-4.5 MCG/ACT IN AERO: 2.0000 | INHALATION_SPRAY | Freq: Two times a day (BID) | RESPIRATORY_TRACT | 6 refills | Status: DC
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
## 2020-08-13 NOTE — Patient Instructions (Signed)
Nice to meet you  I think it is most likely that you have asthma.  I increased the Symbicort to the high dose - 2 puffs twice a day. A new prescription was sent in.  I hope this helps the breathing. There are more tests and other medicines we can think of trying but we will wait to see if the inhaler changed helps.  Come back for follow up with Dr. Carmon Sails in 4 months.

## 2020-12-18 ENCOUNTER — Ambulatory Visit (INDEPENDENT_AMBULATORY_CARE_PROVIDER_SITE_OTHER): Payer: BC Managed Care – PPO | Admitting: Pulmonary Disease

## 2020-12-18 ENCOUNTER — Encounter: Payer: Self-pay | Admitting: Pulmonary Disease

## 2020-12-18 ENCOUNTER — Other Ambulatory Visit: Payer: Self-pay

## 2020-12-18 VITALS — BP 122/86 | HR 80 | Temp 97.8°F | Ht 68.0 in | Wt 205.8 lb

## 2020-12-18 DIAGNOSIS — R0600 Dyspnea, unspecified: Secondary | ICD-10-CM

## 2020-12-18 DIAGNOSIS — R0609 Other forms of dyspnea: Secondary | ICD-10-CM

## 2020-12-18 DIAGNOSIS — J4520 Mild intermittent asthma, uncomplicated: Secondary | ICD-10-CM

## 2020-12-18 NOTE — Patient Instructions (Addendum)
Nice to see you again!  Use Singulair 1 pill nightly - this is to treat allergies and asthma symptoms  Continue the high dose Symbicort - 2 puffs twice a day  We will consider more intense therapies and blood work in the future if still not better in the coming months.   Return to clinic in 3 months with Dr. Silas Flood for follow up

## 2020-12-22 ENCOUNTER — Telehealth: Payer: Self-pay | Admitting: Pulmonary Disease

## 2020-12-22 MED ORDER — MONTELUKAST SODIUM 10 MG PO TABS: 10.0000 mg | ORAL_TABLET | Freq: Every day | ORAL | 11 refills | Status: DC

## 2020-12-22 NOTE — Telephone Encounter (Signed)
Called and spoke with patient who states that she was supposed to have RX for Singulair sent into pharmacy Thursday at her appointment but pharmacy never got it. Looked at patients med list and doesn't look like it was sent in. Apologized to patient and she verified preferred pharmacy. RX has been sent in. Nothing further needed at this time.

## 2020-12-31 ENCOUNTER — Other Ambulatory Visit: Payer: Self-pay | Admitting: Specialist

## 2020-12-31 DIAGNOSIS — M5412 Radiculopathy, cervical region: Secondary | ICD-10-CM

## 2021-01-06 ENCOUNTER — Other Ambulatory Visit: Payer: Self-pay | Admitting: Specialist

## 2021-01-06 DIAGNOSIS — M5412 Radiculopathy, cervical region: Secondary | ICD-10-CM

## 2021-01-15 ENCOUNTER — Other Ambulatory Visit: Payer: BC Managed Care – PPO

## 2021-01-16 ENCOUNTER — Ambulatory Visit
Admission: RE | Admit: 2021-01-16 | Discharge: 2021-01-16 | Disposition: A | Discharge status: Home / Self Care | Payer: BC Managed Care – PPO | Source: Ambulatory Visit | Attending: Specialist | Admitting: Specialist

## 2021-01-16 ENCOUNTER — Other Ambulatory Visit: Payer: BC Managed Care – PPO

## 2021-01-16 ENCOUNTER — Other Ambulatory Visit: Payer: Self-pay

## 2021-01-16 DIAGNOSIS — M5412 Radiculopathy, cervical region: Secondary | ICD-10-CM
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
## 2021-02-18 ENCOUNTER — Emergency Department (HOSPITAL_BASED_OUTPATIENT_CLINIC_OR_DEPARTMENT_OTHER): Payer: BC Managed Care – PPO | Admitting: Radiology

## 2021-02-18 ENCOUNTER — Emergency Department (HOSPITAL_BASED_OUTPATIENT_CLINIC_OR_DEPARTMENT_OTHER)
Admission: EM | Admit: 2021-02-18 | Discharge: 2021-02-18 | Disposition: A | Discharge status: Home / Self Care | Payer: BC Managed Care – PPO | Attending: Emergency Medicine | Admitting: Emergency Medicine

## 2021-02-18 ENCOUNTER — Encounter (HOSPITAL_BASED_OUTPATIENT_CLINIC_OR_DEPARTMENT_OTHER): Payer: Self-pay | Admitting: Emergency Medicine

## 2021-02-18 ENCOUNTER — Telehealth: Payer: Self-pay | Admitting: Pulmonary Disease

## 2021-02-18 ENCOUNTER — Other Ambulatory Visit: Payer: Self-pay

## 2021-02-18 DIAGNOSIS — N189 Chronic kidney disease, unspecified: Secondary | ICD-10-CM | POA: Diagnosis not present

## 2021-02-18 DIAGNOSIS — Z7951 Long term (current) use of inhaled steroids: Secondary | ICD-10-CM | POA: Diagnosis not present

## 2021-02-18 DIAGNOSIS — R0789 Other chest pain: Secondary | ICD-10-CM | POA: Diagnosis not present

## 2021-02-18 DIAGNOSIS — R0602 Shortness of breath: Secondary | ICD-10-CM | POA: Diagnosis present

## 2021-02-18 DIAGNOSIS — Z87891 Personal history of nicotine dependence: Secondary | ICD-10-CM | POA: Diagnosis not present

## 2021-02-18 DIAGNOSIS — J449 Chronic obstructive pulmonary disease, unspecified: Secondary | ICD-10-CM | POA: Diagnosis not present

## 2021-02-18 DIAGNOSIS — T7840XA Allergy, unspecified, initial encounter: Secondary | ICD-10-CM | POA: Insufficient documentation

## 2021-02-18 LAB — BASIC METABOLIC PANEL
Anion gap: 12 (ref 5–15)
BUN: 16 mg/dL (ref 6–20)
CO2: 22 mmol/L (ref 22–32)
Calcium: 9.2 mg/dL (ref 8.9–10.3)
Chloride: 107 mmol/L (ref 98–111)
Creatinine, Ser: 0.73 mg/dL (ref 0.44–1.00)
GFR, Estimated: >60 mL/min (ref >60)
Glucose, Bld: 101 mg/dL — ABNORMAL HIGH (ref 70–99)
Potassium: 3.4 mmol/L — ABNORMAL LOW (ref 3.5–5.1)
Sodium: 141 mmol/L (ref 135–145)

## 2021-02-18 LAB — CBC WITH DIFFERENTIAL/PLATELET
Abs Immature Granulocytes: 0.15 10*3/uL — ABNORMAL HIGH (ref 0.00–0.07)
Basophils Absolute: 0 10*3/uL (ref 0.0–0.1)
Basophils Relative: 0 %
Eosinophils Absolute: 0 10*3/uL (ref 0.0–0.5)
Eosinophils Relative: 0 %
HCT: 42.9 % (ref 36.0–46.0)
Hemoglobin: 14.4 g/dL (ref 12.0–15.0)
Immature Granulocytes: 2 %
Lymphocytes Relative: 13 %
Lymphs Abs: 1.3 10*3/uL (ref 0.7–4.0)
MCH: 31.2 pg (ref 26.0–34.0)
MCHC: 33.6 g/dL (ref 30.0–36.0)
MCV: 93.1 fL (ref 80.0–100.0)
Monocytes Absolute: 0.8 10*3/uL (ref 0.1–1.0)
Monocytes Relative: 9 %
Neutro Abs: 7.2 10*3/uL (ref 1.7–7.7)
Neutrophils Relative %: 76 %
Platelets: 239 10*3/uL (ref 150–400)
RBC: 4.61 MIL/uL (ref 3.87–5.11)
RDW: 12.3 % (ref 11.5–15.5)
WBC: 9.5 10*3/uL (ref 4.0–10.5)
nRBC: 0 % (ref 0.0–0.2)

## 2021-02-18 LAB — TROPONIN I (HIGH SENSITIVITY): Troponin I (High Sensitivity): <2 ng/L (ref <18)

## 2021-02-18 NOTE — Telephone Encounter (Signed)
ATC pt. VM box is full. WCB today.

## 2021-02-18 NOTE — Discharge Instructions (Addendum)
Follow-up with your pulmonologist.  Come back to ER if you develop worsening chest pain, difficulty breathing or other new concerning symptom.

## 2021-02-18 NOTE — ED Triage Notes (Signed)
Pt reports allergic reaction to singulair w/SOB and swelling for past month.  Pt had been on steroids until 2 weeks ago for other issue and states since stopping steroids saily reaction has been getting worse.  Pt continued to take singulair through yesterday.

## 2021-02-18 NOTE — Telephone Encounter (Signed)
Called and spoke with Sheila Spencer letting her know the info stated by Dr. Silas Flood that she needed to stop the montelukast and go to the ED. Sheila Spencer verbalized understanding. Nothing further needed.

## 2021-02-18 NOTE — ED Notes (Signed)
Patient taken to x-ray, will obtain EKG and blood when patient returns
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
## 2021-02-18 NOTE — Telephone Encounter (Signed)
Called and spoke with pt who states she started having increased SOB about 1 month ago. States that she feels like she has been having a reaction to the montelukast. Pt said she has been told by others that her face looked swollen and stated she started being told this about 2 weeks ago.  Pt said that the swelling in her face has gotten worse and also has had swelling in her neck too. Pt said that she feels like someone is sitting on her chest which has also been affecting her breathing. Also states that she has had vision issues and a headache.  With all the side effects that she has been having, pt said that the montelukast is the only med that showed these side effects that she was having and figured that her reactions were coming from this.  Pt said that she did take a pill last night 6/28.  Due to the reaction that she is having, pt wants to know what can be recommended.  Since pt is allergic to a lot of meds, I have posted her allergies below: Allergies  Allergen Reactions   Eggs Or Egg-Derived Products Anaphylaxis   Fish Allergy Anaphylaxis   Penicillins Anaphylaxis   Gabapentin Swelling and Rash   Lyrica [Pregabalin] Rash   Benadryl [Diphenhydramine Hcl] Other (See Comments)    Affects breathing    Betasept Surgical Scrub [Chlorhexidine Gluconate] Hives    Rash, redness, itching   Dr. Silas Flood, please advise.

## 2021-02-18 NOTE — Telephone Encounter (Signed)
Stop the montelukast. Based on symptoms concerned about anaphylaxis. There are 2 case reports in 2009 linking montelukast to anaphylaxis. Incredibly rare. Given anaphylaxis is the concern, recommend she urgently present to ED. Additional work up for her shortness of breath and swelling can be performed there.

## 2021-04-07 ENCOUNTER — Other Ambulatory Visit: Payer: Self-pay | Admitting: Pulmonary Disease

## 2021-04-07 DIAGNOSIS — J4520 Mild intermittent asthma, uncomplicated: Secondary | ICD-10-CM

## 2021-04-13 ENCOUNTER — Telehealth: Payer: Self-pay | Admitting: Pulmonary Disease

## 2021-04-13 DIAGNOSIS — J4520 Mild intermittent asthma, uncomplicated: Secondary | ICD-10-CM

## 2021-04-13 MED ORDER — ALBUTEROL SULFATE HFA 108 (90 BASE) MCG/ACT IN AERS: 2.0000 | INHALATION_SPRAY | Freq: Four times a day (QID) | RESPIRATORY_TRACT | 4 refills | Status: AC | PRN

## 2021-04-13 MED ORDER — BUDESONIDE-FORMOTEROL FUMARATE 160-4.5 MCG/ACT IN AERO: INHALATION_SPRAY | RESPIRATORY_TRACT | 6 refills | Status: AC

## 2021-04-13 NOTE — Telephone Encounter (Signed)
Rx for Symbicort and ventolin has been sent to preferred pharmacy.  Patient is aware and voiced her understanding.  Nothing further needed.

## 2021-05-01 ENCOUNTER — Encounter: Payer: Self-pay | Admitting: Pulmonary Disease

## 2021-05-01 ENCOUNTER — Ambulatory Visit (INDEPENDENT_AMBULATORY_CARE_PROVIDER_SITE_OTHER): Payer: BC Managed Care – PPO | Admitting: Pulmonary Disease

## 2021-05-01 ENCOUNTER — Other Ambulatory Visit: Payer: Self-pay

## 2021-05-01 VITALS — BP 132/80 | HR 81 | Temp 97.8°F | Ht 68.0 in | Wt 208.2 lb

## 2021-05-01 DIAGNOSIS — J4520 Mild intermittent asthma, uncomplicated: Secondary | ICD-10-CM | POA: Diagnosis not present

## 2021-05-01 DIAGNOSIS — R0609 Other forms of dyspnea: Secondary | ICD-10-CM

## 2021-05-01 DIAGNOSIS — R0600 Dyspnea, unspecified: Secondary | ICD-10-CM

## 2021-05-01 DIAGNOSIS — M7989 Other specified soft tissue disorders: Secondary | ICD-10-CM

## 2021-05-01 LAB — CBC WITH DIFFERENTIAL/PLATELET
Basophils Absolute: 0 10*3/uL (ref 0.0–0.1)
Basophils Relative: 0.4 % (ref 0.0–3.0)
Eosinophils Absolute: 0.1 10*3/uL (ref 0.0–0.7)
Eosinophils Relative: 1.1 % (ref 0.0–5.0)
HCT: 41.7 % (ref 36.0–46.0)
Hemoglobin: 14.2 g/dL (ref 12.0–15.0)
Lymphocytes Relative: 19.3 % (ref 12.0–46.0)
Lymphs Abs: 1.5 10*3/uL (ref 0.7–4.0)
MCHC: 33.9 g/dL (ref 30.0–36.0)
MCV: 95 fl (ref 78.0–100.0)
Monocytes Absolute: 0.8 10*3/uL (ref 0.1–1.0)
Monocytes Relative: 9.9 % (ref 3.0–12.0)
Neutro Abs: 5.4 10*3/uL (ref 1.4–7.7)
Neutrophils Relative %: 69.3 % (ref 43.0–77.0)
Platelets: 190 10*3/uL (ref 150.0–400.0)
RBC: 4.4 Mil/uL (ref 3.87–5.11)
RDW: 13.2 % (ref 11.5–15.5)
WBC: 7.8 10*3/uL (ref 4.0–10.5)

## 2021-05-01 MED ORDER — BREZTRI AEROSPHERE 160-9-4.8 MCG/ACT IN AERO: 2.0000 | INHALATION_SPRAY | Freq: Two times a day (BID) | RESPIRATORY_TRACT | 0 refills | Status: AC

## 2021-05-01 NOTE — Progress Notes (Signed)
$'@Patient'J$  ID: Sheila Spencer, female    DOB: 06-21-1961, 60 y.o.   MRN: [id number]  Chief Complaint  Patient presents with   Follow-up    Referring provider: [facility]*  HPI:   60 year old whom we are seeing for follow up of DOE felt largely related to asthma.   Symbicort had helped.  At last visit frequency of albuterol use was down.  However had added montelukast for refractory mild cough and dyspnea on exertion.  Unfortunate she had a very severe almost near anaphylactic reaction to the montelukast after several pills.  Since then, she has had worsening asthma symptoms.  Worsening shortness of breath, chest tightness, dyspnea with exertion.  Worsening cough.  Continues to report good adherence to Symbicort without similar benefit prior.  She complains of right calf pain as well as swollen right leg.  Admittedly, she says right leg is chronically swollen because she is having vein stripped but more swollen recently and the Pain is new.  In the interim since last visit, she moved from [place] to [facility].  She is currently commuting 4 hours for doctors visits given her insurance limitations.  HPI at initial visit: Patient has intermittent shortness of breath.  However, she notes that since being required to wear mask she has had increased dyspnea on exertion.  Worse with inclines or steps.  Relieved with rest.  Uses Symbicort.  In the past, she could use it 2 puffs once daily and have no breathing issues.  Currently she is using it 2 puffs twice a day to help minimize breathing issues.  Still with breakthrough symptoms.  She is on the mid dose.  Albuterol helps some.  No other clear exacerbating or alleviating factors.  She has longstanding skin issues, eczema.  Dealing with neuropathy as well.  She is under the care of a neurologist for this.  She notes that when she was young, 53 to 60 years old she often would be forced to stand ammonia and bleach and scrub  bathroom at the insistence of her foster parents.  Ever since then she has had breathing issues.  Again usually better controlled but worse with masks.  Reportedly she had PFTs done some 15 years ago that were consistent with COPD per prior pulmonologist.  Those notes nor those test results are available for review.  Most recent chest imaging I can see chest x-ray 11/2012 personally reviewed interpreted as clear lungs, no effusion, infiltrate, pneumothorax.  PMH: Allergies, eczema, peripheral neuropathy Surgical history: Lumbar back surgery, tonsillectomy, hysterectomy Family history: Mother with CAD, diabetes, father with CAD, hypertension Social history: Former smoker, quit 14 years ago, lives in [place] / Pulmonary Flowsheets:   ACT:  Asthma Control Test ACT Total Score  12/18/2020 13    MMRC: mMRC Dyspnea Scale mMRC Score  08/13/2020 1    Epworth:  No flowsheet data found.  Tests:   FENO:  No results found for: NITRICOXIDE  PFT: No flowsheet data found.  WALK:  No flowsheet data found.  Imaging: Personally reviewed and as per EMR discussion this note  Lab Results: Personally reviewed and as per EMR CBC    Component Value Date/Time   WBC 9.5 02/18/2021 1134   RBC 4.61 02/18/2021 1134   HGB 14.4 02/18/2021 1134   HCT 42.9 02/18/2021 1134   PLT 239 02/18/2021 1134   MCV 93.1 02/18/2021 1134   MCH 31.2 02/18/2021 1134   MCHC 33.6 02/18/2021 1134  RDW 12.3 02/18/2021 1134   LYMPHSABS 1.3 02/18/2021 1134   MONOABS 0.8 02/18/2021 1134   EOSABS 0.0 02/18/2021 1134   BASOSABS 0.0 02/18/2021 1134    BMET    Component Value Date/Time   NA 141 02/18/2021 1134   K 3.4 (L) 02/18/2021 1134   CL 107 02/18/2021 1134   CO2 22 02/18/2021 1134   GLUCOSE 101 (H) 02/18/2021 1134   BUN 16 02/18/2021 1134   CREATININE 0.73 02/18/2021 1134   CALCIUM 9.2 02/18/2021 1134   GFRNONAA >60 02/18/2021 1134   GFRAA >90 12/15/2012 0505    BNP No results  found for: BNP  ProBNP No results found for: PROBNP  Specialty Problems   None  Allergies  Allergen Reactions   Eggs Or Egg-Derived Products Anaphylaxis   Fish Allergy Anaphylaxis   Penicillins Anaphylaxis   Gabapentin Swelling and Rash   Lyrica [Pregabalin] Rash   Benadryl [Diphenhydramine Hcl] Other (See Comments)    Affects breathing    Betasept Surgical Scrub [Chlorhexidine Gluconate] Hives    Rash, redness, itching   Cymbalta [Duloxetine Hcl] Swelling   Singulair [Montelukast] Swelling   Savella [Milnacipran Hcl] Swelling and Rash    Immunization History  Administered Date(s) Administered   Pneumococcal Polysaccharide-23 01/13/2012   Tdap 01/13/2012    Past Medical History:  Diagnosis Date   Bipolar disorder ([place])    no meds   Bladder prolapse, female, acquired    Chronic kidney disease    prolapsed bladder    COPD (chronic obstructive pulmonary disease) (HCC)    Environmental allergies    Idiopathic peripheral neuropathy    Midline cystocele    Mixed hyperlipidemia    Peripheral neuropathy    Peripheral vascular disease ([facility])    varicose veins    Tubular adenoma     Tobacco History: Social History   Tobacco Use  Smoking Status Former   Types: Cigarettes   Quit date: 03/13/2006   Years since quitting: 15.1  Smokeless Tobacco Never   Counseling given: Not Answered   Continue to not smoke  Outpatient Encounter Medications as of 05/01/2021  Medication Sig   albuterol (VENTOLIN HFA) 108 (90 Base) MCG/ACT inhaler Inhale 2 puffs into the lungs every 6 (six) hours as needed for wheezing.   Budeson-Glycopyrrol-Formoterol (BREZTRI AEROSPHERE) 160-9-4.8 MCG/ACT AERO Inhale 2 puffs into the lungs in the morning and at bedtime.   budesonide-formoterol (SYMBICORT) 160-4.5 MCG/ACT inhaler INHALE 2 PUFFS INTO THE LUNGS EVERY MORNING AND 2 PUFFS EVERY NIGHT AT BEDTIME   carisoprodol (SOMA) 350 MG tablet Take 350 mg by mouth 4 (four) times daily as needed for  muscle spasms. Only as needed   HYDROcodone-acetaminophen (NORCO) 7.5-325 MG tablet Take 1 tablet by mouth 4 (four) times daily as needed.   meloxicam (MOBIC) 15 MG tablet Take 15 mg by mouth daily.   topiramate (TOPAMAX) 100 MG tablet Take 250 mg by mouth 2 (two) times daily. Morning and evening for migraines   triamcinolone (KENALOG) [id number] % cream 1 application   [DISCONTINUED] montelukast (SINGULAIR) 10 MG tablet Take 1 tablet (10 mg total) by mouth at bedtime.   No facility-administered encounter medications on file as of 05/01/2021.     Review of Systems  Review of Systems  N/a Physical Exam  BP 132/80 (BP Location: Left Arm, Patient Position: Sitting, Cuff Size: Normal)   Pulse 81   Temp 97.8 F (36.6 C) (Oral)   Ht '5\' 8"'$  (1.727 m)   Wt 208 lb 3.2 oz (  94.4 kg)   SpO2 97%   BMI 31.66 kg/m   Wt Readings from Last 5 Encounters:  05/01/21 208 lb 3.2 oz (94.4 kg)  02/18/21 208 lb (94.3 kg)  12/18/20 205 lb 12.8 oz (93.4 kg)  08/13/20 203 lb (92.1 kg)  02/22/14 186 lb (84.4 kg)    BMI Readings from Last 5 Encounters:  05/01/21 31.66 kg/m  02/18/21 31.63 kg/m  12/18/20 31.29 kg/m  08/13/20 30.87 kg/m  02/22/14 28.28 kg/m     Physical Exam General: In chair, no acute distress Eyes: EOMI, icterus Neck: Supple, no JVP Respiratory: Clear to auscultation bilaterally, no wheezing, diminished Cardiovascular: Regular rate and rhythm, no murmurs Abdomen/GI: Nondistended no masses present MSK: Right legswollen with mild pain upon palpation of right calf Neuro: Normal gait, no weakness Psych: Normal mood, full affect   Assessment & Plan:   Dyspnea exertion: Possibly multifactorial.  Concern for poorly controlled asthma given her significant atopic symptoms now worsened after severe allergic reaction to montelukast 6/22.  PFTs for further evaluation ordered today.  Asthma: atopic symptoms. DOE had imp[roved with albuterol, high dose Symbicort. Now worse after  montelukast induced severe allergic reaction.  CBC with differential, IgE, RAST panel today given uncontrolled symptoms with maximum inhaled therapy.  To continue high-dose Symbicort.  Trial Breztri to see if additional bronchodilation provides symptomatic relief.  Samples today.  Can prescribe if helpful.  Right leg swelling/pain: Concern for DVT given appearance, pain on palpation.  Right lower extremity Doppler venous ordered.   Return in about 6 weeks (around 06/12/2021).   Lanier Clam, MD 05/01/2021   I spent 35 minutes in the care of this patient including face-to-face visit, review of records, coordination of care.

## 2021-05-01 NOTE — Patient Instructions (Addendum)
Nice to see you again  I am sorry the breathing is not well controlled  Try the samples of Breztri 2 puffs twice a day.  See if this helps with your breathing.  If it does let me know and I can prescribe it.  Otherwise, return to the Symbicort.  Do not use the Symbicort and Breztri at the same time.  We will get blood work today to look into if you would qualify for more aggressive asthma treatments.  I ordered a ultrasound of your right leg to evaluate if the clot is causing swelling.  I ordered pulmonary function test to be completed at your convenience, hopefully early next week around the time of your ultrasound to further investigate causes of your shortness of breath.  Return to clinic in 6 weeks or sooner as needed with Dr. Silas Flood

## 2021-05-04 ENCOUNTER — Other Ambulatory Visit (HOSPITAL_COMMUNITY): Payer: Self-pay | Admitting: Pulmonary Disease

## 2021-05-04 ENCOUNTER — Other Ambulatory Visit: Payer: Self-pay

## 2021-05-04 ENCOUNTER — Ambulatory Visit (HOSPITAL_COMMUNITY)
Admission: RE | Admit: 2021-05-04 | Discharge: 2021-05-04 | Disposition: A | Discharge status: Home / Self Care | Payer: BC Managed Care – PPO | Source: Ambulatory Visit | Attending: Pulmonary Disease | Admitting: Pulmonary Disease

## 2021-05-04 ENCOUNTER — Telehealth: Payer: Self-pay | Admitting: Pulmonary Disease

## 2021-05-04 ENCOUNTER — Ambulatory Visit (HOSPITAL_BASED_OUTPATIENT_CLINIC_OR_DEPARTMENT_OTHER)
Admission: RE | Admit: 2021-05-04 | Discharge: 2021-05-04 | Disposition: A | Discharge status: Home / Self Care | Payer: BC Managed Care – PPO | Source: Ambulatory Visit | Attending: Pulmonary Disease | Admitting: Pulmonary Disease

## 2021-05-04 DIAGNOSIS — I82401 Acute embolism and thrombosis of unspecified deep veins of right lower extremity: Secondary | ICD-10-CM | POA: Insufficient documentation

## 2021-05-04 DIAGNOSIS — M7989 Other specified soft tissue disorders: Secondary | ICD-10-CM | POA: Diagnosis present

## 2021-05-04 LAB — IGE: IgE (Immunoglobulin E), Serum: 4012 kU/L — ABNORMAL HIGH (ref <=114)

## 2021-05-04 MED ORDER — APIXABAN (ELIQUIS) VTE STARTER PACK (10MG AND 5MG): ORAL_TABLET | ORAL | 0 refills | Status: AC

## 2021-05-04 MED ORDER — APIXABAN (ELIQUIS) VTE STARTER PACK (10MG AND 5MG): ORAL_TABLET | ORAL | 0 refills | Status: DC

## 2021-05-04 NOTE — Telephone Encounter (Signed)
I called and spoke with patient regarding message. Patient needed Eliquis sent different pharmacy, I called and cancel first script and sent to preferred pharmacy. Patient also wanted to know why she needed to have covid test done before PFT and if Dr. Silas Flood can stop it. I informed patient that that is a [facility] policy and not Dr. Silas Flood policy and he cannot change it. Patient is having trouble finding somewhere to make an appt but will try locally. Nothing further needed.

## 2021-05-04 NOTE — Telephone Encounter (Signed)
Patient would like Elquis to be sent to [facility]. [address]. Patient phone number is [phone].

## 2021-05-04 NOTE — Telephone Encounter (Signed)
Patient would like Dr. Silas Flood to give her a call. Patient phone number is [phone].

## 2021-05-04 NOTE — Progress Notes (Signed)
Right lower extremity venous duplex and IVC/iliac vein duplex completed. Refer to "CV Proc" under chart review to view preliminary results.  Critical results discussed with Dr. Silas Flood.  05/04/2021 1:58 PM Kelby Aline., MHA, RVT, RDCS, RDMS

## 2021-05-04 NOTE — Telephone Encounter (Signed)
Critical result positive LLE DVT. Discussed with patient. Eliquis starter pack (10 mg BID x 7 days then 5 mg BID thereafter) sent to local pharmacy. She had questions regarding Covid testing for upcoming PFTs. Routing to Triage to help navigate testing and results that are acceptable for PFTs

## 2021-05-07 LAB — ALLERGEN PROFILE, PERENNIAL ALLERGEN IGE
Alternaria Alternata IgE: 0.2 kU/L — AB
Aspergillus Fumigatus IgE: 0.28 kU/L — AB
Aureobasidi Pullulans IgE: 0.27 kU/L — AB
Candida Albicans IgE: 0.59 kU/L — AB
Cat Dander IgE: >100 kU/L — AB
Chicken Feathers IgE: 1.22 kU/L — AB
Cladosporium Herbarum IgE: 0.23 kU/L — AB
Cow Dander IgE: 24.4 kU/L — AB
D Farinae IgE: 2 kU/L — AB
D Pteronyssinus IgE: 1.97 kU/L — AB
Dog Dander IgE: >100 kU/L — AB
Duck Feathers IgE: 0.45 kU/L — AB
Goose Feathers IgE: 1.61 kU/L — AB
Mouse Urine IgE: 6.3 kU/L — AB
Mucor Racemosus IgE: 0.4 kU/L — AB
Penicillium Chrysogen IgE: 0.23 kU/L — AB
Phoma Betae IgE: 0.23 kU/L — AB
Setomelanomma Rostrat: 0.24 kU/L — AB
Stemphylium Herbarum IgE: 0.37 kU/L — AB

## 2021-05-25 ENCOUNTER — Other Ambulatory Visit: Payer: Self-pay

## 2021-06-02 ENCOUNTER — Telehealth: Payer: Self-pay | Admitting: Pulmonary Disease

## 2021-06-02 NOTE — Telephone Encounter (Signed)
We received a fax from the pharmacy to send in the starter pack for the Eliquis and I wanted to see what dose you wanted to keep this patient on. Please advise.

## 2021-06-02 NOTE — Telephone Encounter (Signed)
Eliquis 5 mg BID x 30 refill 2

## 2021-06-02 NOTE — Telephone Encounter (Signed)
LMTCB with patient. Barnet Pall what pharmacy did the request come from? Left message with patient to clarify as well. Thanks

## 2021-06-03 NOTE — Telephone Encounter (Signed)
I tried to call the patient and I was unable to leave a message as the voicemail was full.

## 2021-06-05 MED ORDER — APIXABAN 5 MG PO TABS: 5.0000 mg | ORAL_TABLET | Freq: Two times a day (BID) | ORAL | 2 refills | Status: AC

## 2021-06-05 NOTE — Telephone Encounter (Signed)
Medication has been sent to [facility] on file. No other concerns.

## 2021-06-16 ENCOUNTER — Ambulatory Visit: Payer: BC Managed Care – PPO | Admitting: Pulmonary Disease

## 2022-11-09 IMAGING — DX DG CHEST 2V
2 series · 2 of 2 positions shown · non-contrast
Comparison: December 11, 2012

CLINICAL DATA: Shortness of breath

EXAM:
CHEST - 2 VIEW

[chest pa]
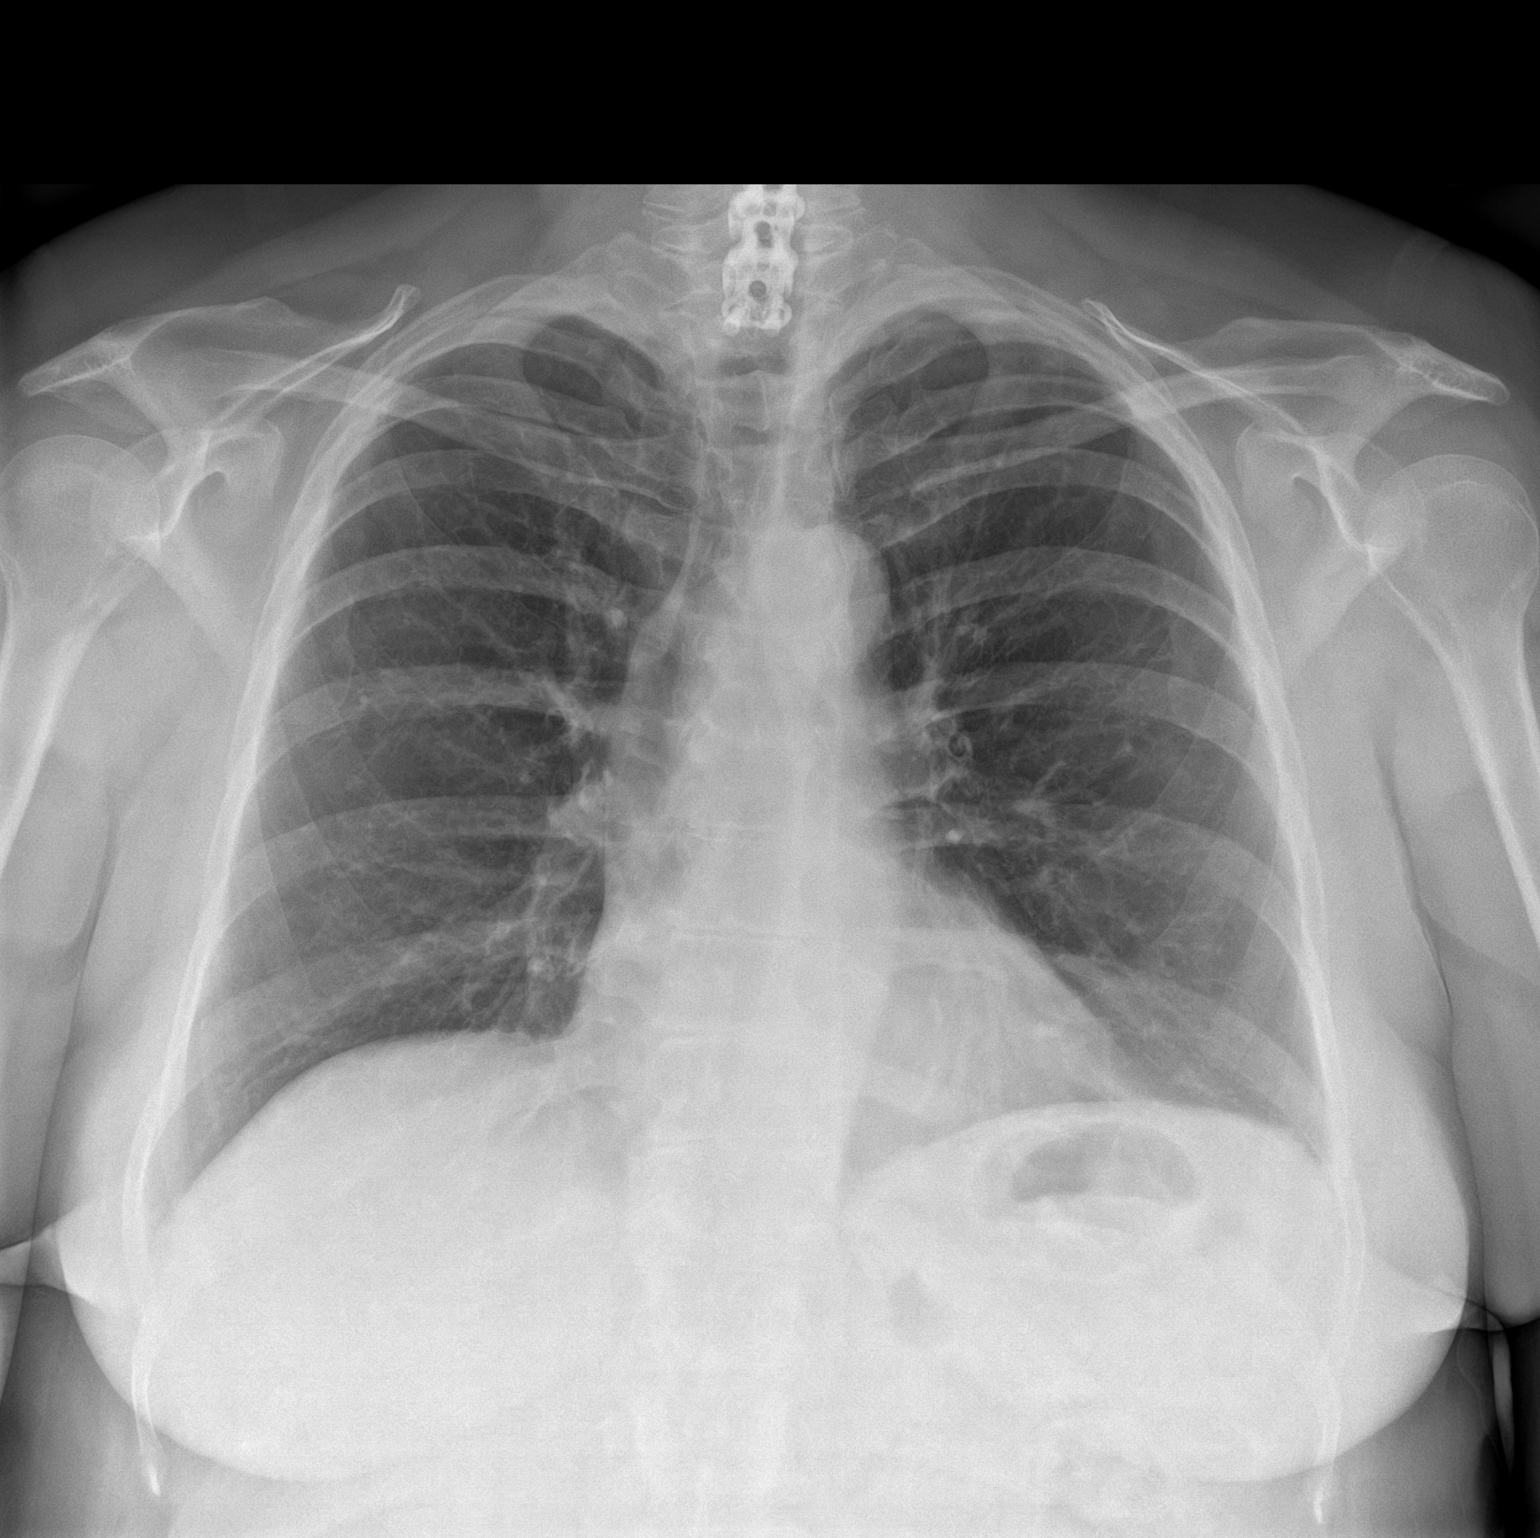

[chest lat]
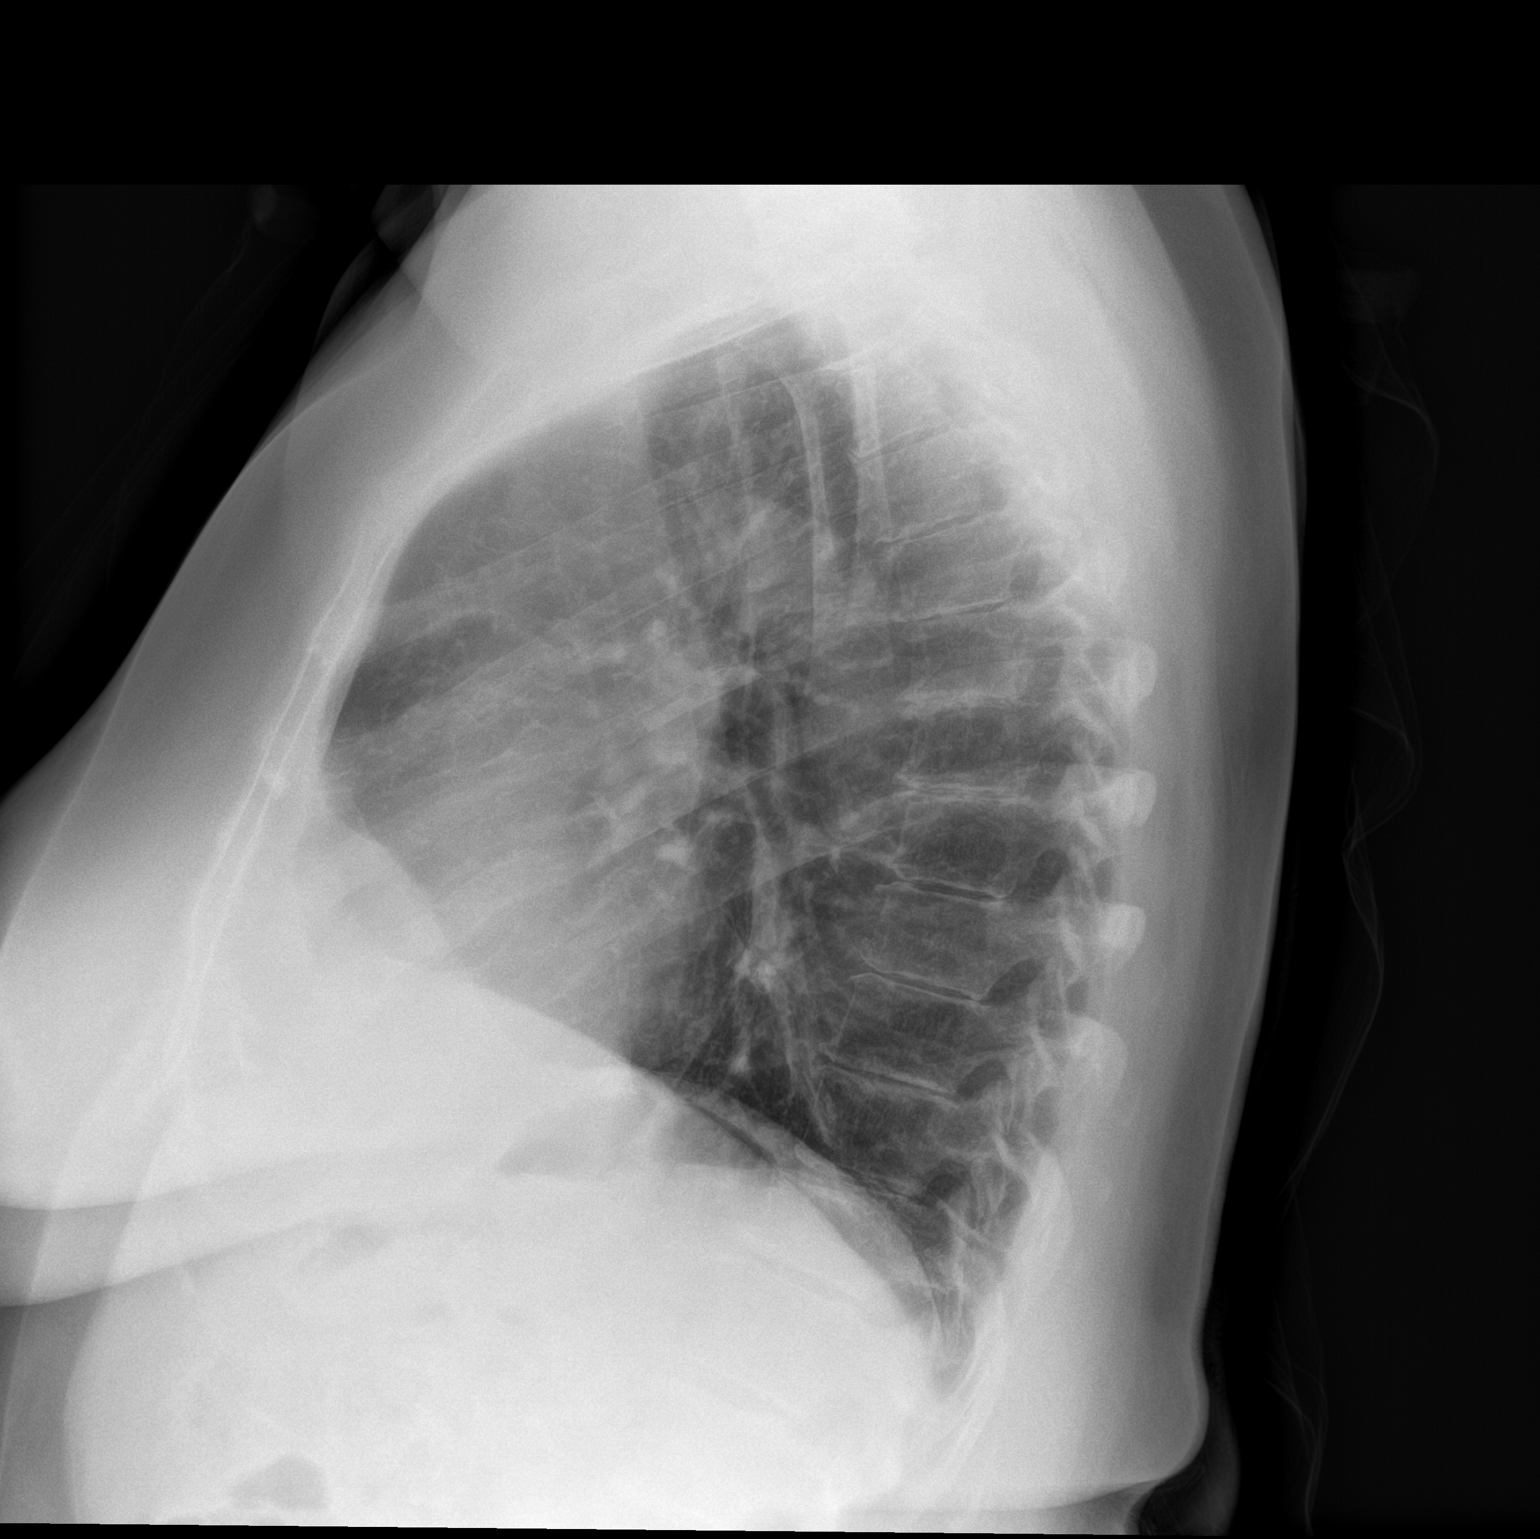

[2 of 2 positions shown; findings below may reference images not displayed]

FINDINGS: Lungs are clear. Heart size and pulmonary vascularity are normal. No
adenopathy. There is degenerative change in the thoracic spine.
There is postoperative change in the lower cervical region.
IMPRESSION: Lungs clear.  Cardiac silhouette normal.
